# Patient Record
Sex: Male | Born: 1945 | Race: White | Hispanic: No | Marital: Single | Smoking: Former smoker
Health system: Southern US, Community
[De-identification: ages and names within clinical notes are randomized; demographics above are authoritative.]

## PROBLEM LIST (undated history)

## (undated) DIAGNOSIS — I2510 Atherosclerotic heart disease of native coronary artery without angina pectoris: Secondary | ICD-10-CM

## (undated) DIAGNOSIS — I10 Essential (primary) hypertension: Secondary | ICD-10-CM

## (undated) DIAGNOSIS — E7800 Pure hypercholesterolemia, unspecified: Secondary | ICD-10-CM

## (undated) DIAGNOSIS — K219 Gastro-esophageal reflux disease without esophagitis: Secondary | ICD-10-CM

## (undated) DIAGNOSIS — F419 Anxiety disorder, unspecified: Secondary | ICD-10-CM

## (undated) DIAGNOSIS — N400 Enlarged prostate without lower urinary tract symptoms: Secondary | ICD-10-CM

## (undated) HISTORY — PX: HERNIA REPAIR: SHX51

## (undated) HISTORY — PX: CARDIAC SURGERY: SHX584

## (undated) HISTORY — PX: CARDIAC CATHETERIZATION: SHX172

---

## 1998-03-10 ENCOUNTER — Ambulatory Visit (HOSPITAL_COMMUNITY): Admission: RE | Admit: 1998-03-10 | Discharge: 1998-03-10 | Payer: Self-pay | Admitting: Cardiology

## 2000-09-22 ENCOUNTER — Ambulatory Visit (HOSPITAL_COMMUNITY): Admission: RE | Admit: 2000-09-22 | Discharge: 2000-09-22 | Payer: Self-pay | Admitting: Cardiology

## 2001-09-05 ENCOUNTER — Encounter: Payer: Self-pay | Admitting: Cardiology

## 2001-09-05 ENCOUNTER — Ambulatory Visit (HOSPITAL_COMMUNITY): Admission: RE | Admit: 2001-09-05 | Discharge: 2001-09-05 | Payer: Self-pay | Admitting: Cardiology

## 2001-09-07 ENCOUNTER — Ambulatory Visit (HOSPITAL_COMMUNITY): Admission: RE | Admit: 2001-09-07 | Discharge: 2001-09-07 | Payer: Self-pay | Admitting: *Deleted

## 2001-09-26 ENCOUNTER — Ambulatory Visit (HOSPITAL_COMMUNITY): Admission: RE | Admit: 2001-09-26 | Discharge: 2001-09-27 | Payer: Self-pay | Admitting: *Deleted

## 2001-12-26 ENCOUNTER — Ambulatory Visit (HOSPITAL_COMMUNITY): Admission: RE | Admit: 2001-12-26 | Discharge: 2001-12-26 | Payer: Self-pay | Admitting: *Deleted

## 2001-12-26 ENCOUNTER — Encounter: Payer: Self-pay | Admitting: *Deleted

## 2002-10-11 ENCOUNTER — Encounter (HOSPITAL_COMMUNITY): Admission: RE | Admit: 2002-10-11 | Discharge: 2002-11-10 | Payer: Self-pay | Admitting: *Deleted

## 2002-10-11 ENCOUNTER — Encounter: Payer: Self-pay | Admitting: *Deleted

## 2004-07-16 ENCOUNTER — Ambulatory Visit: Payer: Self-pay | Admitting: *Deleted

## 2004-07-23 ENCOUNTER — Ambulatory Visit (HOSPITAL_COMMUNITY): Admission: RE | Admit: 2004-07-23 | Discharge: 2004-07-23 | Payer: Self-pay | Admitting: *Deleted

## 2004-07-23 ENCOUNTER — Ambulatory Visit: Payer: Self-pay | Admitting: *Deleted

## 2005-09-22 ENCOUNTER — Ambulatory Visit: Payer: Self-pay | Admitting: *Deleted

## 2006-01-02 ENCOUNTER — Encounter: Payer: Self-pay | Admitting: Emergency Medicine

## 2006-01-02 ENCOUNTER — Inpatient Hospital Stay (HOSPITAL_COMMUNITY): Admission: AD | Admit: 2006-01-02 | Discharge: 2006-01-04 | Payer: Self-pay | Admitting: Cardiology

## 2006-01-02 ENCOUNTER — Ambulatory Visit: Payer: Self-pay | Admitting: Cardiology

## 2006-01-17 ENCOUNTER — Ambulatory Visit: Payer: Self-pay | Admitting: *Deleted

## 2006-10-12 ENCOUNTER — Ambulatory Visit: Payer: Self-pay | Admitting: Internal Medicine

## 2006-10-19 ENCOUNTER — Encounter: Payer: Self-pay | Admitting: Internal Medicine

## 2006-10-19 ENCOUNTER — Ambulatory Visit: Payer: Self-pay | Admitting: Internal Medicine

## 2006-10-19 ENCOUNTER — Ambulatory Visit (HOSPITAL_COMMUNITY): Admission: RE | Admit: 2006-10-19 | Discharge: 2006-10-19 | Payer: Self-pay | Admitting: Internal Medicine

## 2006-10-19 HISTORY — PX: COLONOSCOPY: SHX174

## 2006-12-26 ENCOUNTER — Ambulatory Visit: Payer: Self-pay | Admitting: Cardiology

## 2007-12-22 IMAGING — CR DG CHEST 1V PORT
1 series · 1 of 1 positions shown · non-contrast
Comparison: 07/23/04.

CLINICAL DATA: Chest pain/history of coronary artery stent placement.  
 PORTABLE CHEST - 1 VIEW 01/02/06:

[view not recorded]
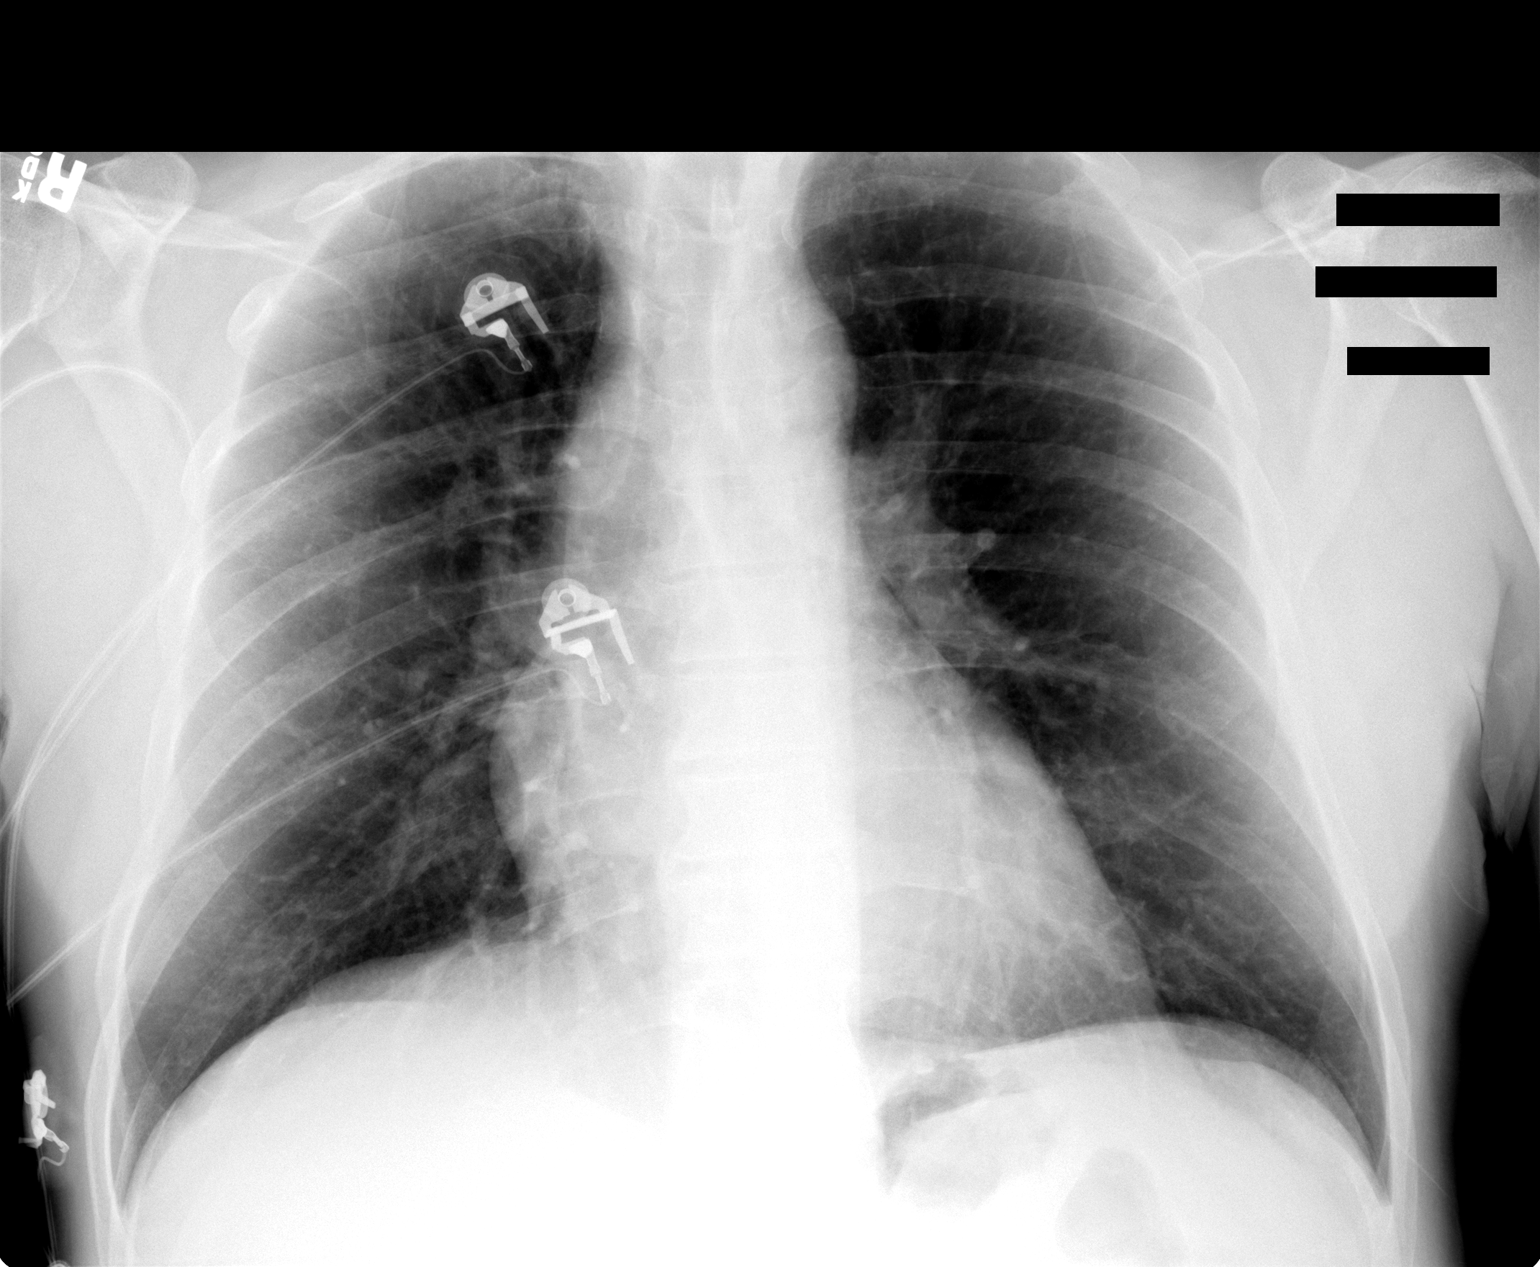

[1 of 1 positions shown; findings below may reference images not displayed]

FINDINGS: The heart size and mediastinal contours are within normal limits.  Both lungs are clear.
IMPRESSION: No acute findings.

## 2008-01-18 ENCOUNTER — Ambulatory Visit: Payer: Self-pay | Admitting: Cardiology

## 2009-11-26 ENCOUNTER — Encounter (INDEPENDENT_AMBULATORY_CARE_PROVIDER_SITE_OTHER): Payer: Self-pay

## 2010-07-08 NOTE — Letter (Signed)
Summary: Recall Colonoscopy/Endoscopy, Change to Office Visit  [facility]  [address]   [place], [place] [address]   Phone: [phone]  Fax: [phone]      November 26, 2009   Connor [name] [address] [place], [place]  [address] September 26, 1945   Dear [name],   According to our records, it is time for you to schedule a Colonoscopy/Endoscopy. However, after reviewing your medical record, we recommend an office visit in order to determine your need for a repeat procedure.  Please call [phone] at your convenience to schedule an office visit. If you have any questions or concerns, please feel free to contact our office.   Sincerely,   Cloria Spring LPN  [facility] Ph: [phone]   Fax: [phone]

## 2010-10-19 NOTE — Consult Note (Signed)
NAME:  Connor [name], Connor [name] NO.:  [id number]   MEDICAL RECORD NO.:  [id number]          PATIENT TYPE:  AMB   LOCATION:  DAY                           FACILITY:  [facility]   PHYSICIAN:  R. Roetta Sessions, M.D. DATE OF BIRTH:  07-15-1945   DATE OF CONSULTATION:  DATE OF DISCHARGE:                                 CONSULTATION   REASON FOR CONSULTATION:  Hemoccult-positive stool.   REFERRING PHYSICIAN:  Kingsley Callander. Ouida Sills, MD   HISTORY OF PRESENT ILLNESS:  Connor [name] is a pleasant 65 year old  Caucasian male sent over through the courtesy of Dr. Ouida Sills for further  workup of Hemoccult-positive stool.  Connor [name] went to Dr. Ouida Sills  recently for routine follow-up.  He underwent digital rectal  examination.  The stool was hemoccult-positive.  Connor [name] has not had  any change in bowel function.  He has 1-3 formed bowel movements daily.  Denies constipation, diarrhea, melena or rectal bleeding.  Has not  really had any abdominal pain.  Denies any change in weight.  Denies any  upper GI tract symptoms such as odynophagia, dysphagia, early satiety,  nausea or vomiting.  He perhaps a couple of reflux episodes monthly  depending on what he eats, for which he might take an over-the-counter  acid blocker with good results.  He does not use any nonsteroidals aside  from one aspirin daily.  His last colonoscopy was his first colonoscopy  at age 84 back on Oct 26, 2006, and this was done for screening  purposes.  I performed the exam and he was found have a normal rectum  and colon.  He was slated to come back in 2011 for a screening exam.   PAST MEDICAL HISTORY:  Crescendo angina and coronary artery disease, for  which he underwent coronary stent placement previously.  He had a  cardiac catheterization last summer, which revealed his stents remain  patent.   PAST SURGERIES:  1. Right inguinal herniorrhaphy.  2. Colonoscopy in 2001.  3. Cardiac catheterization with stent placement and repeat      catheterization last year.   CURRENT MEDICATIONS:  1. Metoprolol 50 mg daily.  2. Vytorin daily.  3. Tramadol daily.  4. Ativan 0.5 mg p.r.n.  5. ASA 325 mg daily.   ALLERGIES:  CODEINE.   FAMILY HISTORY:  Father died with heart attack.  Mother is alive with  diabetes mellitus.  He has 2 brothers and 2 sisters, all of whom have  diabetes.  There is no history of chronic GI or liver illness.   SOCIAL HISTORY:  The patient is widowed.  He is employed with the [name].  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  He does not  smoke.  He rarely consumes alcohol.   REVIEW OF SYSTEMS:  No chest pain or dyspnea on exertion.  No fever,  chills, or change in weight.  Otherwise as in history of present  illness.   PHYSICAL EXAMINATION:  GENERAL:  A pleasant 65 year old gentleman  resting comfortably.  VITAL SIGNS:  Weight 197, height 5 feet  11 inches, temperature 97.5, BP  140/90, pulse 70.  SKIN:  Warm and dry.  He is suntanned.  HEENT:  No scleral icterus.  Conjunctivae pink.  Oral cavity:  No  lesions.  CHEST:  Lungs are clear to auscultation.  CARDIAC:  Regular rate and rhythm without murmur, gallop or rub.  ABDOMEN:  Nondistended.  Positive bowel sounds, soft, nontender, without  appreciable mass or organomegaly.  EXTREMITIES:  No edema.  RECTAL:  Deferred to the time of colonoscopy.   IMPRESSION:  Connor [name] is a pleasant 65 year old gentleman with a  Hemoccult-positive stool on digital rectal examination.  Hemoccult-  positive stool digital rectal examination has somewhat more limited  specificity than three sequential cards obtained with stooling.  However, nonetheless, he is Hemoccult-positive.  It is good to know that  he had a colonoscopy with negative findings in 2001.  There is really  nothing in his history that suggests significant upper gastrointestinal  tract pathology.   RECOMMENDATIONS:  Connor [name] ought to go ahead and have a diagnostic   colonoscopy now.  The potential risks, benefits and alternatives have  been reviewed, questions answered.  He is agreeable.   PLAN:  To perform colonoscopy in very near future at his convenience.  We will make further recommendations at that time.   I would like Dr. Carylon Perches for allowing me to see this nice gentleman  once again.      [name], M.D.  Electronically Signed     RMR/MEDQ  D:  10/12/2006  T:  10/12/2006  Job:  [id number]   cc:   Kingsley Callander. Ouida Sills, MD  Fax: [phone]

## 2010-10-19 NOTE — H&P (Signed)
NAME:  Connor, [name] NO.:  [id number]   MEDICAL RECORD NO.:  [id number]          PATIENT TYPE:  AMB   LOCATION:  DAY                           FACILITY:  [facility]   PHYSICIAN:  R. Roetta Sessions, M.D. DATE OF BIRTH:  May 10, 1946   DATE OF ADMISSION:  10/19/2006  DATE OF DISCHARGE:  LH                              HISTORY & PHYSICAL   PROCEDURE:  Colonoscopy, biopsy.   INDICATIONS FOR PROCEDURE:  The patient is a 65 year old gentleman with  no lower GI tract symptoms having been hemoccult positive on digital  exam recently.  He has one to three formed bowel movements daily.  Has  not had any melena or gross bleeding, no abdominal pain.  No upper GI  tract symptoms.  There is no family history of GI malignancy.  His last  colonoscopy was back in 2001 where he was found to have a normal rectum  and colon.  Colonoscopy is now being done.  This approach has been  discussed with patient at length.  Potential risks, benefits and  alternatives have been reviewed.  Questions answered.  Please see  documentation in medical record.   PROCEDURE NOTE:  O2 saturation, blood pressure, pulse and respirations  were monitored throughout the entire procedure.  Conscious sedation  Versed 4 mg IV, Demerol 5 mg IV, Phenergan 12.5 mg IV push prior to the  procedure.   INSTRUMENT:  Pentax video chip system.   FINDINGS:  Digital rectal exam revealed no abnormalities.  The prep was  good.  Colon:  The colonic mucosa was surveyed from the rectosigmoid  junction through the left, transverse, and right colon to the  appendiceal orifice, ileocecal valve, and cecum. These structures were  well seen and photographed for the record.  There was a tiny 3 mm polyp  at the base of the cecum.  It was cold biopsied/removed.  The scope was  slowly and cautiously withdrawn.  All previously mentioned mucosal  surfaces were again seen.  There were no other colonic abnormalities.  The scope was pulled down  to the rectum for thorough examination of the  rectal mucosa including retroflexed view of the anal verge.  Numerous  minimally friable anal __________.  No hemorrhoids or other  abnormalities were seen.  The patient tolerated the procedure and was  reactivated.   ENDOSCOPIC IMPRESSION:  Minimally friable anal canal with normal rectum,  diminutive cecal polyp status post cone biopsy/removal.  The remainder  of the colonic mucosa appeared normal.   RECOMMENDATIONS:  1. Follow up on pathology.  2. Further recommendations to follow.      [name], M.D.  Electronically Signed     RMR/MEDQ  D:  10/19/2006  T:  10/19/2006  Job:  [id number]   cc:   Kingsley Callander. Ouida Sills, MD  Fax: [phone]

## 2010-10-19 NOTE — Letter (Signed)
January 18, 2008    Kingsley Callander. Ouida Sills, MD  [address]  [place], [place] [address]   RE:  Connor, [name]  MRN:  [id number]  /  DOB:  [birth year]   Dear Connor [name],   Connor [name] returns to the office for continued assessment and treatment  of cardiovascular risk factors.  Since his last visit, he has continued  to superbly.  He is active in both his work and personal lives without  any significant symptoms.  Blood pressure control has been good.  He  continues to refrain from cigarette smoking.  He has been switched from  Vytorin to simvastatin with plans for a lipid profile within the next  month.   PHYSICAL EXAMINATION:  GENERAL:  On exam, pleasant gentleman in no acute  distress.  VITAL SIGNS:  The weight is 199, 1 pound less than last year.  Blood  pressure 125/80, heart rate 70 and regular, and respirations 13.  NECK:  No jugular venous distention; no carotid bruits.  CARDIAC:  Normal first and second heart sounds.  ABDOMEN:  Soft and nontender; no organomegaly.  EXTREMITIES:  No edema; distal pulses intact.   IMPRESSION:  Connor [name] continues to do beautifully.  In the absence of  any cardiopulmonary symptoms, I do not think that annual cardiology  followup is necessary.  Please let me know if he develops any symptoms  worrisome for recurrent myocardial ischemia.  Thank you so much for  sharing in the care of this very nice gentleman.    Sincerely,      Gerrit Friends. Dietrich Pates, MD, [facility]  Electronically Signed    RMR/MedQ  DD: 01/18/2008  DT: 01/19/2008  Job #: [id number]

## 2010-10-19 NOTE — Letter (Signed)
December 26, 2006    Kingsley Callander. Ouida Sills, MD  [address]  [place], [place] [address]   RE:  KAIMEN, PEINE  MRN:  [id number]  /  DOB:  1945/09/12   Dear Channing Mutters:   Mr. Leaf returns to the office for continued management of coronary  artery disease and cardiovascular risk factors. Since he underwent  repeat coronary angiography in July of last year, he has done well. He  has no chest pain. He experiences dyspnea only when working hard outside  in hot weather. He has had no significant medical issues nor has he  required hospitalization or evaluation in the emergency department.   CURRENT MEDICATIONS:  1. Mavik 2 mg daily.  2. Aspirin 81 mg daily.  3. Vytorin 10/20 mg daily.  4. Protonix 40 mg daily.  5. Toprol 50 mg daily.   PHYSICAL EXAMINATION:  GENERAL:  Pleasant, fairly trim gentleman in no  acute distress.  VITAL SIGNS:  The weight is 200 pounds, 9 pounds more than last year.  Blood pressure 130/75, heart rate 75 and regular, respirations 18.  NECK:  No jugular venous distention; normal carotid upstrokes without  bruits.  LUNGS:  Clear.  CARDIAC:  Normal first and second heart sounds; minimal early systolic  ejection murmur.  ABDOMEN:  Soft and nontender; no masses; no organomegaly; aortic  pulsation not palpable.  EXTREMITIES:  No edema.   LABORATORY DATA:  Laboratory obtain in April reveals a good lipid  profile with total cholesterol of 149, triglycerides of 200, HDL of 32  and LDL of 77.   IMPRESSION:  Mr. Penn continues to do well, now 5 years following  stenting of single vessel disease in the right coronary artery. He had  no significant focal disease on coronary angiography one year ago. We  will continue to optimally manage his risk factors and plan a return  visit in 1 year.    Sincerely,      Gerrit Friends. Dietrich Pates, MD, [facility]  Electronically Signed    RMR/MedQ  DD: 12/26/2006  DT: 12/27/2006  Job #: [id number]

## 2010-10-19 NOTE — Op Note (Signed)
NAME:  Connor, [name] NO.:  [id number]   MEDICAL RECORD NO.:  [id number]          PATIENT TYPE:  AMB   LOCATION:  DAY                           FACILITY:  [facility]   PHYSICIAN:  R. Roetta Sessions, M.D. DATE OF BIRTH:  Jul 01, 1945   DATE OF PROCEDURE:  10/19/2006  DATE OF DISCHARGE:                               OPERATIVE REPORT   PROCEDURE:  Colonoscopy, biopsy.   INDICATIONS FOR PROCEDURE:  The patient is a 65 year old gentleman with  no lower GI tract symptoms having been hemoccult positive on digital  exam recently.  He has one to three formed bowel movements daily.  Has  not had any melena or gross bleeding, no abdominal pain.  No upper GI  tract symptoms.  There is no family history of GI malignancy.  His last  colonoscopy was back in 2001 where he was found to have a normal rectum  and colon.  Colonoscopy is now being done.  This approach has been  discussed with patient at length.  Potential risks, benefits and  alternatives have been reviewed.  Questions answered.  Please see  documentation in medical record.   PROCEDURE NOTE:  O2 saturation, blood pressure, pulse and respirations  were monitored throughout the entire procedure.  Conscious sedation  Versed 4 mg IV, Demerol 5 mg IV, Phenergan 12.5 mg IV push prior to the  procedure.   INSTRUMENT:  Pentax video chip system.   FINDINGS:  Digital rectal exam revealed no abnormalities.  The prep was  good.  Colon:  The colonic mucosa was surveyed from the rectosigmoid  junction through the left, transverse, and right colon to the  appendiceal orifice, ileocecal valve, and cecum. These structures were  well seen and photographed for the record.  There was a tiny 3 mm polyp  at the base of the cecum.  It was cold biopsied/removed.  The scope was  slowly and cautiously withdrawn.  All previously mentioned mucosal  surfaces were again seen.  There were no other colonic abnormalities.  The scope was pulled down to  the rectum for thorough examination of the  rectal mucosa including retroflexed view of the anal verge.  Numerous  minimally friable anal __________.  No hemorrhoids or other  abnormalities were seen.  The patient tolerated the procedure and was  reactivated.   ENDOSCOPIC IMPRESSION:  Minimally friable anal canal with normal rectum,  diminutive cecal polyp status post cone biopsy/removal.  The remainder  of the colonic mucosa appeared normal.   RECOMMENDATIONS:  1. Follow up on pathology.  2. Further recommendations to follow.      [name], M.D.  Electronically Signed     RMR/MEDQ  D:  10/19/2006  T:  10/19/2006  Job:  [id number]   cc:   Kingsley Callander. Ouida Sills, MD  Fax: [phone]

## 2010-10-22 NOTE — Cardiovascular Report (Signed)
[facility]  Patient:    Connor [name], Connor [name] Visit Number: [id number] MRN: [id number]          Service Type: CAT Location: [facility] 2895 01 Attending Physician:  Daisey Must Dictated by:   Daisey Must, M.D. [facility] Proc. Date: 09/07/01 Admit Date:  09/07/2001 Discharge Date: 09/07/2001   CC:         Carylon Perches, M.D.  Thomas C. Wall, M.D. [facility]  Cardiac Catheterization Lab   Cardiac Catheterization  PROCEDURE:  Left heart catheterization with coronary angiography and left ventriculography.  CARDIOLOGIST:  Daisey Must, M.D. [facility]  INDICATION:  Mr. Larusso is a 65 year old male with history of moderate coronary artery disease.  He has been having recurrent substernal chest tightness with exertion as well as occasional episodes at rest.  He was referred for cardiac catheterization.  PROCEDURAL NOTE:  A 6-French sheath was placed in the right femoral artery. Standard Judkins 6-French catheters were utilized.  Contrast was Omnipaque. There were no complications.  CATHETERIZATION RESULTS:  HEMODYNAMICS:  Left ventricular pressure 110/13, aortic pressure 110/70. There was no aortic valve gradient.  LEFT VENTRICULOGRAM:  Wall motion is normal.  Ejection fraction calculated at 62%.  There is no mitral regurgitation.  CORONARY ANGIOGRAPHY: (Right dominant).  Left main has an ostial 30% stenosis.  There was some ventricularization of pressure wave form with catheter engagement.  This did seem to improve with intracoronary nitroglycerin.  Left anterior descending artery has a tubular 30% stenosis in the proximal vessel, 30% in the mid vessel, and diffuse 20% stenosis in the distal vessel. The LAD gives rise to a normal size first diagonal which has a 20% stenosis and a small second diagonal which has a 30% stenosis.  Left circumflex has a 30% stenosis in the mid vessel.  It gives rise to a small OM-1 and a large OM-2.  The right coronary artery  has a focal 60% stenosis in the proximal vessel. The mid vessel has a 30 to 40% stenosis, and the distal vessel has a diffuse 20% stenosis.  The distal right coronary artery gives rise to a normal size posterior descending artery, a small first posterolateral branch, an normal size second posterolateral branch, and a small posterolateral branch.  IMPRESSIONS: 1. Normal left ventricular systolic function. 2. One-vessel coronary artery disease characterized by moderate disease in    the proximal right coronary artery of borderline severity.  PLAN:  A stress Cardiolite will be scheduled to rule out ischemia.  If this does demonstrated ischemia in the inferior wall of the right coronary artery, it could be treated percutaneously.  Otherwise the patient will be managed medically. Dictated by:   Daisey Must, M.D. [facility] Attending Physician:  Daisey Must DD:  09/07/01 TD:  09/08/01 Job: [id number]

## 2010-10-22 NOTE — H&P (Signed)
NAME:  Connor [name], Connor [name] NO.:  [id number]   MEDICAL RECORD NO.:  [id number]          PATIENT TYPE:  INP   LOCATION:  6531                         FACILITY:  [facility]   PHYSICIAN:  Olga Millers, M.D. LHCDATE OF BIRTH:  03-31-1946   DATE OF ADMISSION:  01/02/2006  DATE OF DISCHARGE:                                HISTORY & PHYSICAL   PRIMARY CARDIOLOGIST:  Dr. Vida Roller   REASON FOR ADMISSION:  Mr. Liz is a 65 year old male, with history of  single-vessel coronary artery disease status post prior stenting (non-drug-  eluting) of the proximal right coronary artery in April 2003, who now is  transferred from [facility] ER with symptoms worrisome for  unstable angina pectoris.   The patient has not had any subsequent catheterizations since undergoing  percutaneous intervention.  An exercise stress Cardiolite in May of 2004 was  notable for chest pain and associated ST abnormalities, but no evidence of  ischemia by perfusion imaging; calculated ejection fraction 61%.   At approximately 4:30 this morning, after the patient had already been up  and about, he developed lower sternal chest tightness which then developed  into more of a pain (6/10), reminiscent of his pre - PCI symptoms in the  2003.  Also noted some radiation to the right shoulder and some nausea, but  no vomiting.  He had no associated diaphoresis or dyspnea.  He took one  sublingual nitroglycerin, with some improvement in symptoms but not complete  resolution.  He was then taken to the emergency room by his sister where he  was treated with additional sublingual nitroglycerin, IV nitroglycerin, and  heparin.  He reports improvement of his symptoms with subsequent resolution  while en route.  He is currently asymptomatic.   Initial cardiac markers are negative.  Electrocardiogram notable for sinus  bradycardia at 58 bpm with LVH but no ischemic changes.   Of note, the patient  reports having taken his medications earlier today.   The patient does report some occasional chest pain in the recent past, most  notably about a week ago while working in the yard.  He did have some chest  tightness at that time, but pointed out that this morning's episode was much  more pronounced.   ALLERGIES:  CODEINE.   CURRENT MEDICATIONS:  1.  Toprol XL 50 mEq daily.  2.  Vytorin 10/20 daily.  3.  Coated aspirin 81 daily.  4.  Mavik two daily.  5.  Nitrostat p.r.n.   PAST MEDICAL HISTORY:  1.  Coronary artery disease.      1.  As outlined above.  2.  Hypertension.  3.  Hyperlipidemia.  4.  Gastroesophageal reflux disease.  5.  Remote right inguinal hernia repair.   SOCIAL HISTORY:  Patient is widowed, currently living with one of his  sisters in [place].  He has no children.  He is a retired Garment/textile technologist for [employer]  He has not smoked tobacco since 2000 and  drinks alcohol only on occasion.   FAMILY HISTORY:  Father died  at age 76, secondary to fatal myocardial  infarction (his first). Mother age 49, alive and well with no known history  of heart disease.  Brother age 65, history of MI.   REVIEW OF SYSTEMS:  As noted per HPI, no recent evidence of overt GI  bleeding.  Denies intermittent claudication.  Denies orthopnea, PND,  exertional dyspnea, or edema.  Remaining systems negative.   PHYSICAL EXAMINATION:  VITAL SIGNS:  Blood pressure 120/60, pulse of  currently the low 60s, temperature 97.9, saturations 100% on 3 liters.  GENERAL:  65 year old male in no apparent distress.  HEENT:  Normocephalic, atraumatic.  NECK:  Palpable carotid pulse without bruits.  LUNGS: Clear to auscultation in all fields.  HEART:  Regular rhythm (S1 and S2), no murmurs, rubs or gallops.  ABDOMEN:  Soft, nontender with intact bowel sounds without bruits.  EXTREMITIES:  Palpable femoral pulses without bruits; palpable dorsalis  pedis pulses without edema.   NEUROLOGY:  No focal deficit.   Admission chest x-ray:  Pending.   Admission electrocardiogram:  Sinus bradycardia 58 bpm with normal axis; LVH  by voltage; no ischemic changes.   LABORATORY DATA:  MB 2.1, troponin I less than 0.05.  Sodium 138, potassium  4.2, BUN 10, creatinine 1.0, glucose 104.   IMPRESSION:  1.  Unstable angina pectoris.  2.  Single-vessel coronary artery disease.      1.  Status post stent (non-drug-eluting) proximal right coronary artery          in 2003.      2.  Normal left ventricular function.      3.  Non-ischemic exercise stress Cardiolite 2004.  3.  Sinus bradycardia.      1.  Tolerating beta-blocker.  4.  Hypertension.  5.  Hyperlipidemia.  6.  Gastroesophageal reflux disease.  7.  History of tobacco.   PLAN:  The patient will be admitted to telemetry unit for close monitoring  and further evaluation and management of symptoms worrisome for unstable  angina pectoris.   RECOMMENDATIONS:  Proceed with cardiac catheterization tomorrow, and the  patient is agreeable with this plan.  The risks/benefits have been  discussed.   The patient will be continued on aspirin, Toprol with close monitoring for  bradycardia, statin, and ACE inhibitor.  We will also continue IV  nitroglycerin and heparin and consider addition of a 2B3A inhibitor if  cardiac markers are abnormal.   In addition to cycling cardiac markers, we will check a complete metabolic  profile, CBC/diff, PT/PTT, TSH, and fasting lipid profile in morning.  EKG  will also reported a more pressure and EKG will be repeated in the morning  as well.      Gene Serpe, P.A. [facility]    ______________________________  Olga Millers, M.D. [facility]    GS/MEDQ  D:  01/02/2006  T:  01/02/2006  Job:  [id number]   cc:   Kingsley Callander. Ouida Sills, MD  Fax: [phone]

## 2010-10-22 NOTE — Procedures (Signed)
   NAME:  Connor [name], Connor [name] NO.:  [id number]   MEDICAL RECORD NO.:  [id number]                    PATIENT TYPE:  PREC   LOCATION:                                       FACILITY:   PHYSICIAN:  Vida Roller, M.D.                DATE OF BIRTH:  03/25/1946   DATE OF PROCEDURE:  10/11/2002  DATE OF DISCHARGE:                                    STRESS TEST   EXERCISE CARDIOLITE   INDICATION:  The patient is a 65 year old gentleman with history of coronary  artery disease status post PTCA and stenting of the RCA in April 2003.  He  had normal LV function at that time.  He has had no objective assessment  since that time.  He comes in complaining with recurrent chest discomfort  which is relieved by nitroglycerin.  He denies any chest discomfort with  exertion.   BASELINE DATA:  EKG shows sinus rhythm at 52 beats per minute with  nonspecific ST abnormalities.  Blood pressure 150/82.   DESCRIPTION OF PROCEDURE:  The patient exercised for eight minutes 57  seconds to Bruce Protocol stage 3 and 10.1 METS.  The patient reported a  chest tightness of about 5/10 at seven-and-a-half minutes into exercise  accompanied by shortness of breath.  This gradually resolved in recovery.  EKG showed no arrhythmias; however, did show minimal ST depression in leads  II, V4, and V5.  These changes resolved in recovery.  Maximum heart rate  achieved was 141 beats per minute which is 87% of predicted maximum.  Maximum blood pressure was 200/90.   Final images and results are pending M.D. review.     Amy Mercy Riding, P.A. [facility]                     Vida Roller, M.D.    AB/MEDQ  D:  10/11/2002  T:  10/11/2002  Job:  [id number]

## 2010-10-22 NOTE — Discharge Summary (Signed)
NAME:  ALIF, PETRAK NO.:  [id number]   MEDICAL RECORD NO.:  [id number]          PATIENT TYPE:  INP   LOCATION:  6531                         FACILITY:  [facility]   PHYSICIAN:  Micheline Chapman, MD   DATE OF BIRTH:  22-Dec-1945   DATE OF ADMISSION:  01/02/2006  DATE OF DISCHARGE:  01/04/2006                                 DISCHARGE SUMMARY   ADDENDUM:  Post catheterization, Mr. Atchley cath site was stable but he was  having difficulty voiding.  An in and out Foley got over 1000 mL.  This was  removed and he attempted again to void but was unable to void. A Foley  catheter was inserted and a urology consult was called.   Mr. Stanforth was seen by Dr. Wanda Plump. Dr. Wanda Plump recommended discharging  Mr. Iacovelli with the Foley catheter in place.  He was given instructions on  its removal and follow up with urology.   On January 04, 2006,  Mr. Formica was tolerating the Foley catheter very well.  He was not having any chest pain or shortness of breath and considered  stable for discharge with outpatient follow-up arranged.   DISCHARGE INSTRUCTIONS:  As previously with the exception of he is to  discontinue the Foley catheter as instructed on January 05, 2006 and follow up  with Dr. Wanda Plump on January 05, 2006 at 1:40 p.m.      Theodore Demark, P.A. [facility]      Micheline Chapman, MD  Electronically Signed    RB/MEDQ  D:  01/04/2006  T:  01/04/2006  Job:  [id number]   cc:   Kingsley Callander. Ouida Sills, MD  [employer] [facility], M.D.

## 2010-10-22 NOTE — Discharge Summary (Signed)
NAME:  JAIRON, RIPBERGER NO.:  [id number]   MEDICAL RECORD NO.:  [id number]          PATIENT TYPE:  INP   LOCATION:  6531                         FACILITY:  [facility]   PHYSICIAN:  Clarcona Bing, M.D. [facility] OF BIRTH:  09-05-1945   DATE OF ADMISSION:  01/02/2006  DATE OF DISCHARGE:                                 DISCHARGE SUMMARY   PROCEDURES:  1.  Cardiac catheterization.  2.  Coronary arteriogram.  3.  Left ventriculogram.   PRIMARY DIAGNOSES:  1.  Chest pain, cardiac enzymes negative for myocardial infarction and      proton pump inhibitor added.  2.  Status post bare-metal stent to the RCA in April 2003.  3.  Hypertension.  4.  Hyperlipidemia.  5.  Status post hernia repair.  6.  ALLERGY OR INTOLERANCE TO CODEINE.  7.  Family history of premature coronary artery disease.   Time at discharge 33 minutes.   HOSPITAL COURSE:  Mr. Elliot Gurney is a 65 year old male with a previous history of  coronary artery disease.  He develop mid sternal chest tightness that  reached a 6/10 and was reminiscent of his pre PCI symptoms.  His pain  persisted, so he came to the emergency room.  He was admitted for further  evaluation and treatment.   His chest pain resolved.  His cardiac enzymes were negative for MI.  It was  felt that cardiac catheterization was indicated, and this was performed on  January 03, 2006.   The cardiac catheterization showed less than 10% in-stent restenosis in the  RCA.  He had a 30% lesion in the LAD and circumflex, but this was  nonobstructive, and his EF was 60%.   Post cath, Mr. Hoglund was having some problems voiding.  If this resolves and  his cath site remained stable, he is tentatively considered stable for  discharge on January 03, 2006 with outpatient follow-up arranged.   DISCHARGE INSTRUCTIONS:  His activity level is to be reduced for the next 48  hours.  He is to call our office for problems with the catheterization site.  He is to follow up  with Dr. Dorethea Clan on August 14 at 1:15, and he is to follow  up with Dr. Ouida Sills as needed.   DISCHARGE MEDICATIONS:  1.  Toprol XL 50 mg q.d.  2.  Mavik 2 mg q.d.  3.  Vytorin 10/20 mg q.d.  4.  Coated aspirin 81 mg q.d.  5.  Nitroglycerin sublingual p.r.n.  6.  Protonix 40 mg q. day.      Theodore Demark, P.A. [facility]      [place] Bing, M.D. [facility]  Electronically Signed    RB/MEDQ  D:  01/03/2006  T:  01/03/2006  Job:  [id number]   cc:   Kingsley Callander. Ouida Sills, MD

## 2010-10-22 NOTE — Assessment & Plan Note (Signed)
[facility]                         [place] [facility] OFFICE NOTE   CODEY, BURLING                        MRN:          [id number]  DATE:01/17/2006                            DOB:          [birth year]    PRIMARY CARE PHYSICIAN:  Kingsley Callander. Ouida Sills, M.D.   REASON FOR VISIT:  Mr. Deatra Ina returns.  He had a heart catheterization on  January 03, 2006, for some chest discomfort, ruled out for myocardial  infarction, and the heart catheterization showed less than the 10% in-stent  restenosis, non-obstructive disease in the other coronary arteries.  LV  systolic function was normal.  He has done fine since then.  No pain, no  troubles, walking without any difficulties, back to work already.   CURRENT MEDICATIONS:  1. Mavik 2 mg daily.  2. Aspirin 81 mg daily.  3. Vytorin 10/20 once daily.  4. Protonix 40 mg daily.  5. Flomax one pill daily.  6. Toprol XL 50 mg daily.   PHYSICAL EXAMINATION:  VITAL SIGNS:  His blood pressure is 142/80, his pulse  is 88, he weighs 191 pounds.  CHEST:  Clear.  NECK:  He has no jugular venous distention or carotid bruits.  EXTREMITIES:  His lower extremities are without edema.  CARDIOVASCULAR:  Regular with no murmur.  SKIN:  Calf site looks well-healed.  There is no bruit, no ecchymoses.   PLAN:  To see him back in a year for routine followup, address his secondary  prophylaxis for coronary disease.                                   Farris Has. Dorethea Clan, MD   JMH/MedQ  DD:  01/17/2006  DT:  01/17/2006  Job #:  [id number]   cc:   Kingsley Callander. Ouida Sills, MD

## 2010-10-22 NOTE — Procedures (Signed)
NAME:  Connor [name], Connor [name] NO.:  [id number]   MEDICAL RECORD NO.:  [id number]          PATIENT TYPE:  OUT   LOCATION:  RAD                           FACILITY:  [facility]   PHYSICIAN:  Vida Roller, M.D.   DATE OF BIRTH:  Jun 05, 1946   DATE OF PROCEDURE:  07/23/2004  DATE OF DISCHARGE:                                  ECHOCARDIOGRAM   PRIMARY CARE PHYSICIAN:  Kingsley Callander. Ouida Sills, MD.   TAPE NUMBER:  [id number].   TAPE COUNT:  [id number].   HISTORY:  This is a 65 year old man with dyspnea and known coronary disease.   DESCRIPTION OF PROCEDURE:  The technical quality of the study is adequate.  M-mode tracing of the aorta is 32 mm.   The left atrium is 40 mm.   The septum is 12 mm.   The posterior wall is 12 mm.   The left ventricular diastolic dimension is 38 mm.   The left ventricular systolic dimension is 27 mm.   2-D AND DOPPLER IMAGING:  The left ventricle is normal size with normal  systolic function.  The estimated ejection fraction is 55-65%.  There are no  obvious wall motion abnormalities.  There was mild concentric left  ventricular hypertrophy.   The right ventricle is normal size with normal systolic function.   The right atrium appears to be normal size though difficult to see.  The  left atrium is just slightly enlarged.   There is no pericardial effusion.   The aortic valve is mildly sclerotic with no stenosis or regurgitation.   The mitral valve has mild annular calcification with trace regurgitation.   The tricuspid valve has trace regurgitation.   The pulmonic valve is not well seen.   The inferior vena cava is not well seen.   The aortic size appears to be top normal, though it is difficult to say, as  this was not adequately measured and only seen in two views.      JH/MEDQ  D:  07/23/2004  T:  07/23/2004  Job:  [id number]   cc:   Kingsley Callander. Ouida Sills, MD  [address]  [place]  [place] [address]  Fax: [phone]

## 2010-10-22 NOTE — Cardiovascular Report (Signed)
NAME:  Connor [name], TENCH NO.:  [id number]   MEDICAL RECORD NO.:  [id number]          PATIENT TYPE:  INP   LOCATION:  6531                         FACILITY:  [facility]   PHYSICIAN:  Micheline Chapman, MD   DATE OF BIRTH:  08-Apr-1946   DATE OF PROCEDURE:  01/03/2006  DATE OF DISCHARGE:                              CARDIAC CATHETERIZATION   PROCEDURE PERFORMED:  1.  Left heart catheterization.  2.  Coronary angiogram.  3.  Left ventricular angiogram.  4.  Arterial closure with an Angio-Seal.   INDICATIONS:  [name] is a 65 year old male with known coronary artery  disease status post stenting of the right coronary artery in 2003 who  presented with unstable angina.  He was referred for cardiac catheterization  for definitive evaluation of his coronary anatomy.   PROCEDURE NOTE:  The risks and indications of the procedure were explained  and the patient fully understood.  Informed consent was obtained.  The right  groin was prepped and draped in normal sterile fashion.  Lidocaine 1% was  used for local anesthesia.  The patient was given 1 mg of Versed and 25 mcg  of fentanyl for conscious sedation.  Right femoral arterial access was  obtained using the modified Seldinger technique and a 6-French right femoral  arterial sheath was placed.  Catheters used included a 6-French angled  pigtail, JL-4, and a JR-4.  At the conclusion of the procedure a 6-French  Angio-Seal closure device was deployed in the common femoral artery.   FINDINGS:  Aortic pressure was 102/64 with a mean of 81.  Left ventricular  systolic pressure was 114 and diastolic pressure was 12.   Left coronary artery:  The left main coronary artery was normal.  The left  anterior descending had a 30% stenosis just after the first septal  perforator in the proximal segment.  The mid and distal left anterior  descending coronary had no significant disease.  The first diagonal branch  was a medium-sized vessel  and was normal.  The left circumflex vessel had a  30% proximal stenosis and the remainder of the left circumflex was normal.  There was a small first obtuse marginal branch present without significant  disease and a medium-sized ramus branch that also had no significant  disease.   Right coronary artery:  There was 30-40% stenosis of the proximal right  coronary artery.  The right coronary artery gave off a large first marginal  branch that was normal.  The mid and distal RCA were normal.  The RCA gave  rise to a dominant PDA that had no angiographic disease.   Left ventricular angiogram demonstrated normal left ventricular systolic  function with an ejection fraction approximately 60%.   COMPLICATIONS:  None.   IMPRESSION:  Nonobstructive coronary artery disease.   PLAN:  Continue medical management and plan on discharge home later today  after observation.      Micheline Chapman, MD  Electronically Signed     MDC/MEDQ  D:  01/03/2006  T:  01/03/2006  Job:  [id number]   cc:  Kingsley Callander. Ouida Sills, MD  [facility]

## 2010-10-22 NOTE — Cardiovascular Report (Signed)
[facility]  Patient:    Connor [name], Connor [name] Visit Number: [id number] MRN: [id number]          Service Type: CAT Location: [id number] Attending Physician:  Daisey Must Dictated by:   Daisey Must, M.D. [facility] Proc. Date: 09/26/01 Admit Date:  09/26/2001   CC:         Carylon Perches, M.D.  Thomas C. Wall, M.D. [facility]  Cardiac Catheterization Lab   Cardiac Catheterization  PROCEDURES PERFORMED:  PTCA with stent placement in the proximal right coronary artery.  INDICATIONS:  Mr. Schill is a 65 year old male who has been having exertional angina.  Cardiac catheterization performed approximately three weeks ago revealed a lesion in the proximal right coronary artery of borderline severity.  It was estimated at 60-75% diameter stenosis.  Because of the borderline severity of the lesion, we opted to proceed with a stress imaging study to evaluate for potential ischemia.  The patient underwent a stress Cardiolite in the office, which did reveal a presence of mild inferior infarct with peri-infarct ischemia.  In addition, the patients symptoms of chest pain were reproduced on the treadmill.  We therefore opted to proceed with percutaneous intervention.  PTCA PROCEDURAL NOTE:  We initially placed a 7-French sheath in the right femoral artery.  Angiomax was administered per protocol.  We used a a 7-French JR4 guiding catheter and a BMW wire.  Angiographic images of the right coronary artery were obtained, revealing a 75% stenosis in the proximal vessel, and what was a moderately tortuous artery.  We then deployed a 3.0 x 12 mm Express II stent at a deployment pressure of 15 atm.  The stent was post-dilated with a 3.5 x 8 mm Quantum balloon inflated to 16 atm in the distal aspect of the stent, and 18 atm in the proximal aspect of the stent.  Final angiographic images revealed patency of the right coronary artery, with less than 10% residual stenosis and  TIMI-3 flow.  COMPLICATIONS:  None.  RESULTS:  Successful PTCA with stent placement in the proximal right coronary artery, reducing a 75% stenosis to less than 10% residual with TIMI-3 flow.  PLAN:  Angiomax will be discontinued at this time.  The patient should remain on Plavix for four weeks, and aspirin indefinitely. Dictated by:   Daisey Must, M.D. [facility] Attending Physician:  Daisey Must DD:  09/26/01 TD:  09/26/01 Job: [id number]

## 2011-11-15 DIAGNOSIS — I2510 Atherosclerotic heart disease of native coronary artery without angina pectoris: Secondary | ICD-10-CM | POA: Diagnosis not present

## 2012-04-03 DIAGNOSIS — Z23 Encounter for immunization: Secondary | ICD-10-CM | POA: Diagnosis not present

## 2012-05-09 DIAGNOSIS — E785 Hyperlipidemia, unspecified: Secondary | ICD-10-CM | POA: Diagnosis not present

## 2012-05-09 DIAGNOSIS — Z125 Encounter for screening for malignant neoplasm of prostate: Secondary | ICD-10-CM | POA: Diagnosis not present

## 2012-05-09 DIAGNOSIS — I2510 Atherosclerotic heart disease of native coronary artery without angina pectoris: Secondary | ICD-10-CM | POA: Diagnosis not present

## 2012-05-09 DIAGNOSIS — Z79899 Other long term (current) drug therapy: Secondary | ICD-10-CM | POA: Diagnosis not present

## 2012-05-16 DIAGNOSIS — I2510 Atherosclerotic heart disease of native coronary artery without angina pectoris: Secondary | ICD-10-CM | POA: Diagnosis not present

## 2012-05-16 DIAGNOSIS — Z1212 Encounter for screening for malignant neoplasm of rectum: Secondary | ICD-10-CM | POA: Diagnosis not present

## 2012-11-14 DIAGNOSIS — I2510 Atherosclerotic heart disease of native coronary artery without angina pectoris: Secondary | ICD-10-CM | POA: Diagnosis not present

## 2012-12-27 ENCOUNTER — Encounter (HOSPITAL_COMMUNITY): Payer: Self-pay | Admitting: *Deleted

## 2012-12-27 ENCOUNTER — Emergency Department (HOSPITAL_COMMUNITY)
Admission: EM | Admit: 2012-12-27 | Discharge: 2012-12-27 | Disposition: A | Discharge status: Home / Self Care | Payer: Medicare Other | Attending: Emergency Medicine | Admitting: Emergency Medicine

## 2012-12-27 DIAGNOSIS — R110 Nausea: Secondary | ICD-10-CM | POA: Insufficient documentation

## 2012-12-27 DIAGNOSIS — R3989 Other symptoms and signs involving the genitourinary system: Secondary | ICD-10-CM | POA: Insufficient documentation

## 2012-12-27 DIAGNOSIS — E7800 Pure hypercholesterolemia, unspecified: Secondary | ICD-10-CM | POA: Insufficient documentation

## 2012-12-27 DIAGNOSIS — R000 Tachycardia, unspecified: Secondary | ICD-10-CM | POA: Diagnosis not present

## 2012-12-27 DIAGNOSIS — Z7982 Long term (current) use of aspirin: Secondary | ICD-10-CM | POA: Insufficient documentation

## 2012-12-27 DIAGNOSIS — Z79899 Other long term (current) drug therapy: Secondary | ICD-10-CM | POA: Insufficient documentation

## 2012-12-27 DIAGNOSIS — M549 Dorsalgia, unspecified: Secondary | ICD-10-CM | POA: Diagnosis not present

## 2012-12-27 DIAGNOSIS — R350 Frequency of micturition: Secondary | ICD-10-CM | POA: Insufficient documentation

## 2012-12-27 DIAGNOSIS — R3915 Urgency of urination: Secondary | ICD-10-CM | POA: Insufficient documentation

## 2012-12-27 DIAGNOSIS — R109 Unspecified abdominal pain: Secondary | ICD-10-CM | POA: Diagnosis not present

## 2012-12-27 DIAGNOSIS — I2510 Atherosclerotic heart disease of native coronary artery without angina pectoris: Secondary | ICD-10-CM | POA: Diagnosis not present

## 2012-12-27 DIAGNOSIS — R300 Dysuria: Secondary | ICD-10-CM | POA: Diagnosis not present

## 2012-12-27 DIAGNOSIS — R339 Retention of urine, unspecified: Secondary | ICD-10-CM | POA: Insufficient documentation

## 2012-12-27 HISTORY — DX: Atherosclerotic heart disease of native coronary artery without angina pectoris: I25.10

## 2012-12-27 HISTORY — DX: Pure hypercholesterolemia, unspecified: E78.00

## 2012-12-27 LAB — BASIC METABOLIC PANEL
BUN: 28 mg/dL — ABNORMAL HIGH (ref 6–23)
CO2: 26 mEq/L (ref 19–32)
Calcium: 10.4 mg/dL (ref 8.4–10.5)
Chloride: 100 mEq/L (ref 96–112)
Creatinine, Ser: 1.47 mg/dL — ABNORMAL HIGH (ref 0.50–1.35)
GFR calc Af Amer: 54 mL/min — ABNORMAL LOW (ref >90)
GFR calc non Af Amer: 47 mL/min — ABNORMAL LOW (ref >90)
Glucose, Bld: 125 mg/dL — ABNORMAL HIGH (ref 70–99)
Potassium: 4 mEq/L (ref 3.5–5.1)
Sodium: 137 mEq/L (ref 135–145)

## 2012-12-27 LAB — URINALYSIS, ROUTINE W REFLEX MICROSCOPIC
Bilirubin Urine: NEGATIVE
Glucose, UA: NEGATIVE mg/dL
Ketones, ur: NEGATIVE mg/dL
Leukocytes, UA: NEGATIVE
Nitrite: NEGATIVE
Protein, ur: NEGATIVE mg/dL
Specific Gravity, Urine: 1.025 (ref 1.005–1.030)
Urobilinogen, UA: 0.2 mg/dL (ref 0.0–1.0)
pH: 6 (ref 5.0–8.0)

## 2012-12-27 LAB — URINE MICROSCOPIC-ADD ON

## 2012-12-27 LAB — CBC WITH DIFFERENTIAL/PLATELET
Basophils Absolute: 0 10*3/uL (ref 0.0–0.1)
Basophils Relative: 0 % (ref 0–1)
Eosinophils Absolute: 0 10*3/uL (ref 0.0–0.7)
Eosinophils Relative: 0 % (ref 0–5)
HCT: 48.7 % (ref 39.0–52.0)
Hemoglobin: 17.5 g/dL — ABNORMAL HIGH (ref 13.0–17.0)
Lymphocytes Relative: 9 % — ABNORMAL LOW (ref 12–46)
Lymphs Abs: 1 10*3/uL (ref 0.7–4.0)
MCH: 31.2 pg (ref 26.0–34.0)
MCHC: 35.9 g/dL (ref 30.0–36.0)
MCV: 86.8 fL (ref 78.0–100.0)
Monocytes Absolute: 1.2 10*3/uL — ABNORMAL HIGH (ref 0.1–1.0)
Monocytes Relative: 11 % (ref 3–12)
Neutro Abs: 9.5 10*3/uL — ABNORMAL HIGH (ref 1.7–7.7)
Neutrophils Relative %: 80 % — ABNORMAL HIGH (ref 43–77)
Platelets: 155 10*3/uL (ref 150–400)
RBC: 5.61 MIL/uL (ref 4.22–5.81)
RDW: 13.7 % (ref 11.5–15.5)
WBC: 11.8 10*3/uL — ABNORMAL HIGH (ref 4.0–10.5)

## 2012-12-27 MED ORDER — HYDROMORPHONE HCL PF 1 MG/ML IJ SOLN
1.0000 mg | Freq: Once | INTRAMUSCULAR | Status: AC
  Administered 2012-12-27: 1 mg via INTRAVENOUS
  Filled 2012-12-27: qty 1

## 2012-12-27 MED ORDER — ONDANSETRON HCL 4 MG/2ML IJ SOLN
4.0000 mg | Freq: Once | INTRAMUSCULAR | Status: AC
  Administered 2012-12-27: 4 mg via INTRAVENOUS
  Filled 2012-12-27: qty 2

## 2012-12-27 MED ORDER — MORPHINE SULFATE 4 MG/ML IJ SOLN
6.0000 mg | Freq: Once | INTRAMUSCULAR | Status: AC
  Administered 2012-12-27: 6 mg via INTRAVENOUS
  Filled 2012-12-27: qty 1

## 2012-12-27 MED ORDER — MORPHINE SULFATE 2 MG/ML IJ SOLN
INTRAMUSCULAR | Status: AC
  Filled 2012-12-27: qty 1

## 2012-12-27 MED ORDER — SODIUM CHLORIDE 0.9 % IV BOLUS (SEPSIS)
1000.0000 mL | Freq: Once | INTRAVENOUS | Status: AC
  Administered 2012-12-27: 1000 mL via INTRAVENOUS

## 2012-12-27 NOTE — ED Notes (Signed)
Attempted to use bladder scanner. Unable due to battery dead. Pt cathed with # 14 initial urine return 250 cc dark amber urine.

## 2012-12-27 NOTE — ED Notes (Signed)
Hx of "bladder blockage", pt unable to states what was causing blockage, pt c/o "dribble" with urinating, last voided an hour ago, c/o pain to lower abd that radiates to lower back

## 2012-12-27 NOTE — ED Provider Notes (Signed)
History  This chart was scribed for Raeford Razor, MD by Bennett Scrape, ED Scribe. This patient was seen in room APA15/APA15 and the patient's care was started at 2:07 PM.  CSN: [id number] Arrival date & time 12/27/12  1240  First MD Initiated Contact with Patient 12/27/12 1344     Chief Complaint  Patient presents with  . Dysuria    The history is provided by the patient. No language interpreter was used.    HPI Comments: Connor [name] is a 67 y.o. male who presents to the Emergency Department complaining of 2 days of suprapubic abdominal pain that began radiating around the the bilateral lower back this morning. He describes the pain as pressure and lists difficulty urinating described as increased straining, dysuria, frequency, urgency, nausea and decreased urine output as associated symptoms. He reports that urination worsens the pain, but he denies any other modifying factors. He admits to experiencing one prior episode that resolved after one day 2 to 3 months ago. He denies following up with a specialist. He denies emesis, fevers, chills or penile, testicular or groin lesions or abnormalities. He reports a prior hernia surgery done through the umbilicus, but he denies any recent abdominal or back surgeries. He states that he was seen at [facility] 6 or 7 years ago for a kidney blockage but denies needing any follow up or surgeries for this condition.  Past Medical History  Diagnosis Date  . Coronary artery disease   . High cholesterol    Past Surgical History  Procedure Laterality Date  . Cardiac surgery    . Hernia repair     No family history on file. History  Substance Use Topics  . Smoking status: Never Smoker   . Smokeless tobacco: Not on file  . Alcohol Use: No    Review of Systems  Gastrointestinal: Positive for nausea and abdominal pain. Negative for vomiting.  Genitourinary: Positive for dysuria, urgency, frequency and difficulty urinating. Negative for  discharge.  Musculoskeletal: Positive for back pain.  All other systems reviewed and are negative.    Allergies  Other  Home Medications   Current Outpatient Rx  Name  Route  Sig  Dispense  Refill  . aspirin EC 81 MG tablet   Oral   Take 81 mg by mouth daily.         Marland Kitchen LORazepam (ATIVAN) 1 MG tablet   Oral   Take 1 mg by mouth every 8 (eight) hours.         . metoprolol (LOPRESSOR) 100 MG tablet   Oral   Take 100 mg by mouth daily.         . naproxen sodium (ALEVE) 220 MG tablet   Oral   Take 220 mg by mouth 2 (two) times daily as needed.         . nitroGLYCERIN (NITROSTAT) 0.4 MG SL tablet   Sublingual   Place 0.4 mg under the tongue every 5 (five) minutes as needed for chest pain.         . ranitidine (ZANTAC) 150 MG tablet   Oral   Take 150 mg by mouth daily as needed for heartburn.         . simvastatin (ZOCOR) 40 MG tablet   Oral   Take 40 mg by mouth at bedtime.         . trandolapril (MAVIK) 2 MG tablet   Oral   Take 2 mg by mouth daily.  Triage Vitals: BP 142/87  Pulse 72  Temp(Src) 98 F (36.7 C) (Oral)  Resp 18  SpO2 98%  Physical Exam  Nursing note and vitals reviewed. Constitutional: He is oriented to person, place, and time. He appears well-developed and well-nourished. No distress.  Uncomfortable appearing   HENT:  Head: Normocephalic and atraumatic.  Eyes: Conjunctivae and EOM are normal.  Neck: Neck supple. No tracheal deviation present.  Cardiovascular: Normal rate.   Mildly tachy  Pulmonary/Chest: Effort normal and breath sounds normal. No respiratory distress.  Abdominal: Soft. He exhibits no distension. There is tenderness (suprapubic). There is guarding (voluntary). There is no rebound.  Right CVA tenderness  Musculoskeletal: Normal range of motion.  Neurological: He is alert and oriented to person, place, and time.  Skin: Skin is warm and dry.  Psychiatric: He has a normal mood and affect. His behavior  is normal.    ED Course  Procedures (including critical care time)  Medications  sodium chloride 0.9 % bolus 1,000 mL (not administered)  ondansetron (ZOFRAN) injection 4 mg (not administered)  morphine 4 MG/ML injection 6 mg (not administered)    DIAGNOSTIC STUDIES: Oxygen Saturation is 98% on room air, normal by my interpretation.    COORDINATION OF CARE: 2:10 PM-Discussed treatment plan which includes medications, CBC panel, BMP and UA with pt at bedside and pt agreed to plan.   3:27 PM-Pt rechecked and still reports pain. Will order another round of medications.  Labs Reviewed - No data to display No results found.  1. Urinary retention     MDM  Urinary retention. Foley placed. Urology FU.   I personally preformed the services scribed in my presence. The recorded information has been reviewed is accurate. Raeford Razor, MD.   Raeford Razor, MD 12/27/12 [phone]

## 2012-12-27 NOTE — ED Notes (Signed)
Painful urination and difficulty urinating x 2 days with lower abd pain radiating to lower back.

## 2012-12-27 NOTE — ED Notes (Signed)
Patient given discharge instruction, verbalized understand. Patient  out of the department in wheelchair.

## 2012-12-31 ENCOUNTER — Emergency Department (HOSPITAL_COMMUNITY)
Admission: EM | Admit: 2012-12-31 | Discharge: 2012-12-31 | Disposition: A | Discharge status: Home / Self Care | Payer: Medicare Other | Attending: Emergency Medicine | Admitting: Emergency Medicine

## 2012-12-31 ENCOUNTER — Encounter (HOSPITAL_COMMUNITY): Payer: Self-pay

## 2012-12-31 DIAGNOSIS — Z79899 Other long term (current) drug therapy: Secondary | ICD-10-CM | POA: Diagnosis not present

## 2012-12-31 DIAGNOSIS — N400 Enlarged prostate without lower urinary tract symptoms: Secondary | ICD-10-CM | POA: Diagnosis not present

## 2012-12-31 DIAGNOSIS — E7800 Pure hypercholesterolemia, unspecified: Secondary | ICD-10-CM | POA: Diagnosis not present

## 2012-12-31 DIAGNOSIS — Z7982 Long term (current) use of aspirin: Secondary | ICD-10-CM | POA: Insufficient documentation

## 2012-12-31 DIAGNOSIS — I2510 Atherosclerotic heart disease of native coronary artery without angina pectoris: Secondary | ICD-10-CM | POA: Insufficient documentation

## 2012-12-31 DIAGNOSIS — R339 Retention of urine, unspecified: Secondary | ICD-10-CM | POA: Diagnosis not present

## 2012-12-31 NOTE — ED Notes (Signed)
Urinary retention. Cath removed today @ 4pm @ PMD's office. Has not voided since. bladder distended and painful

## 2012-12-31 NOTE — ED Notes (Signed)
Pt reports having catheter placed in department on Thurs.  Pt saw PCP today to have cath removed. Pt states he hasn't urinated since cath removal.

## 2013-01-01 ENCOUNTER — Other Ambulatory Visit (HOSPITAL_COMMUNITY): Payer: Self-pay | Admitting: Urology

## 2013-01-01 DIAGNOSIS — N400 Enlarged prostate without lower urinary tract symptoms: Secondary | ICD-10-CM | POA: Diagnosis not present

## 2013-01-01 DIAGNOSIS — R339 Retention of urine, unspecified: Secondary | ICD-10-CM | POA: Diagnosis not present

## 2013-01-03 ENCOUNTER — Ambulatory Visit (HOSPITAL_COMMUNITY)
Admission: RE | Admit: 2013-01-03 | Discharge: 2013-01-03 | Disposition: A | Discharge status: Home / Self Care | Payer: Medicare Other | Source: Ambulatory Visit | Attending: Urology | Admitting: Urology

## 2013-01-03 DIAGNOSIS — R9389 Abnormal findings on diagnostic imaging of other specified body structures: Secondary | ICD-10-CM | POA: Insufficient documentation

## 2013-01-03 DIAGNOSIS — N289 Disorder of kidney and ureter, unspecified: Secondary | ICD-10-CM | POA: Diagnosis not present

## 2013-01-03 DIAGNOSIS — R3916 Straining to void: Secondary | ICD-10-CM | POA: Insufficient documentation

## 2013-01-03 DIAGNOSIS — N400 Enlarged prostate without lower urinary tract symptoms: Secondary | ICD-10-CM

## 2013-01-03 NOTE — ED Provider Notes (Signed)
CSN: [id number]     Arrival date & time 12/31/12  2116 History     First MD Initiated Contact with Patient 12/31/12 7163    67 year old male with urinary retention. Patient was recently evaluated by myself for the same. Get a Foley catheter placed at that time. He followed up his PCPs office today. The catheter was removed. Since it was removed she's been having increasing lower bowel discomfort. He has been unable to void. Is having the same symptoms he was having previously. He was given urology followup information  But has not contacted them at this point. No other complaints. He has not noted any blood in his urine since he was last evaluated in the ED. Fevers or chills. No recent medication changes. Patient had another urinary catheter placed just before my evaluation today. Reports symptoms much improved.    Chief Complaint  Patient presents with  . Urinary Retention   (Consider location/radiation/quality/duration/timing/severity/associated sxs/prior Treatment) HPI  Past Medical History  Diagnosis Date  . Coronary artery disease   . High cholesterol    Past Surgical History  Procedure Laterality Date  . Cardiac surgery    . Hernia repair     No family history on file. History  Substance Use Topics  . Smoking status: Never Smoker   . Smokeless tobacco: Not on file  . Alcohol Use: No    Review of Systems  All systems reviewed and negative, other than as noted in HPI.   Allergies  Other  Home Medications   Current Outpatient Rx  Name  Route  Sig  Dispense  Refill  . aspirin EC 81 MG tablet   Oral   Take 81 mg by mouth daily.         Marland Kitchen LORazepam (ATIVAN) 1 MG tablet   Oral   Take 1 mg by mouth every 8 (eight) hours.         . metoprolol (LOPRESSOR) 100 MG tablet   Oral   Take 100 mg by mouth daily.         . naproxen sodium (ALEVE) 220 MG tablet   Oral   Take 220 mg by mouth 2 (two) times daily as needed.         . nitroGLYCERIN (NITROSTAT) 0.4  MG SL tablet   Sublingual   Place 0.4 mg under the tongue every 5 (five) minutes as needed for chest pain.         . ranitidine (ZANTAC) 150 MG tablet   Oral   Take 150 mg by mouth daily as needed for heartburn.         . simvastatin (ZOCOR) 40 MG tablet   Oral   Take 40 mg by mouth at bedtime.         . trandolapril (MAVIK) 2 MG tablet   Oral   Take 2 mg by mouth daily.          BP 137/84  Pulse 70  Temp(Src) 98 F (36.7 C) (Oral)  Resp 18  SpO2 98% Physical Exam  Nursing note and vitals reviewed. Constitutional: He appears well-developed and well-nourished. No distress.  HENT:  Head: Normocephalic and atraumatic.  Eyes: Conjunctivae are normal. Right eye exhibits no discharge. Left eye exhibits no discharge.  Neck: Neck supple.  Cardiovascular: Normal rate, regular rhythm and normal heart sounds.  Exam reveals no gallop and no friction rub.   No murmur heard. Pulmonary/Chest: Effort normal and breath sounds normal. No respiratory distress.  Abdominal: Soft.  He exhibits no distension. There is no tenderness.  Genitourinary:  Foley catheter in place. Appears to be draining well. Urine is clear.  Musculoskeletal: He exhibits no edema and no tenderness.  Neurological: He is alert.  Skin: Skin is warm and dry.  Psychiatric: He has a normal mood and affect. His behavior is normal. Thought content normal.    ED Course   Procedures (including critical care time)  Labs Reviewed - No data to display No results found. 1. Urinary retention     MDM  67 year old male with urinary retention. Catheter was replaced. Patient reports relief of symptoms. Patient states that she is given a prescription for Flomax by his primary care provider today. Understands the need for urologic followup.  Raeford Razor, MD 01/03/13 [phone]

## 2013-01-09 DIAGNOSIS — R972 Elevated prostate specific antigen [PSA]: Secondary | ICD-10-CM | POA: Diagnosis not present

## 2013-01-09 DIAGNOSIS — R3919 Other difficulties with micturition: Secondary | ICD-10-CM | POA: Diagnosis not present

## 2013-01-14 DIAGNOSIS — N400 Enlarged prostate without lower urinary tract symptoms: Secondary | ICD-10-CM | POA: Diagnosis not present

## 2013-01-14 DIAGNOSIS — R339 Retention of urine, unspecified: Secondary | ICD-10-CM | POA: Diagnosis not present

## 2013-01-16 ENCOUNTER — Encounter (HOSPITAL_COMMUNITY): Payer: Self-pay | Admitting: Pharmacy Technician

## 2013-01-22 NOTE — Patient Instructions (Addendum)
Connor [name]  01/22/2013   Your procedure is scheduled on:  01/29/2013  Report to [facility] at  900  AM.  Call this number if you have problems the morning of surgery: [phone]   Remember:   Do not eat food or drink liquids after midnight.   Take these medicines the morning of surgery with A SIP OF WATER: ativan, metoprolol, zantac, mavik   Do not wear jewelry, make-up or nail polish.  Do not wear lotions, powders, or perfumes.   Do not shave 48 hours prior to surgery. Men may shave face and neck.  Do not bring valuables to the hospital.  [facility] is not responsible  for any belongings or valuables.  Contacts, dentures or bridgework may not be worn into surgery.  Leave suitcase in the car. After surgery it may be brought to your room.  For patients admitted to the hospital, checkout time is 11:00 AM the day of discharge.   Patients discharged the day of surgery will not be allowed to drive home.  Name and phone number of your driver: family  Special Instructions: Shower using CHG 2 nights before surgery and the night before surgery.  If you shower the day of surgery use CHG.  Use special wash - you have one bottle of CHG for all showers.  You should use approximately 1/3 of the bottle for each shower.   Please read over the following fact sheets that you were given: Pain Booklet, Coughing and Deep Breathing, MRSA Information, Surgical Site Infection Prevention, Anesthesia Post-op Instructions and Care and Recovery After Surgery Transurethral Resection of the Prostate Transurethral resection of the prostate (TURP) is the removal of part of your prostate to treat noncancerous (benign) prostatic hyperplasia (BPH). BPH typically occurs in men older than 40 years. It is the abnormal growth of cells in your prostate. Specifically, it is an abnormal increase in the number of cells that make up your prostate tissue. This causes an increase in the size of your prostate. Often, in the case  of BPH, the prostate becomes so large that it compresses the tube that drains urine out of your body from your bladder (urethra). Eventually, this compression can obstruct the flow of urine from your bladder. This obstruction can cause recurrent bladder infection and difficulties with bladder control and bladder emptying. The goal of TURP is to remove enough prostate tissue to allow for an unobstructed flow of urine, which often resolves the associated conditions. LET YOUR CAREGIVER KNOW ABOUT:  Any allergies you have.  Any medicines you are taking, including herbs, eye drops, over-the-counter medicines, and creams.  Any problems you have had with the use of anesthetics.  Any blood disorders you have, including bleeding problems and clotting problems.  Previous surgeries you have had.  Any prostate infections you have had. RISKS AND COMPLICATIONS Generally, TURP is a safe procedure. However, as with any surgical procedure, complications can occur. Possible complications associated with TURP include:  Difficulty getting an erection.  Scarring, which may cause problems with the flow of your urine.  Injury to your urethra.  Incontinence from injury to the muscle that surrounds your prostate, which controls urine flow.  Infection.  Bleeding.  Injury to your bladder (rare). BEFORE THE PROCEDURE  Your caregiver will tell you when you need to stop eating and drinking. If you take any medicines, your caregiver will tell you which ones you may keep taking and which ones you will have to  stop taking and when.  Just before the procedure you will also receive medicine to make you fall asleep (general anesthetic). This will be given through a tube that is inserted into one of your veins (intravenous [IV] tube). PROCEDURE Your surgeon inserts an instrument that is similar to a telescope with an electric cutting edge (resectoscope) through your urethra to the area of the prostate gland. The  cutting edge is used to remove enlarged pieces of your prostate, one piece at a time. At the end of your procedure, a flexible tube (catheter) will be inserted into your urethra to drain your bladder. Special plastic bags filled with solution will be connected to the end of the catheter. The solution will be used to irrigate blood from your bladder while you heal.  AFTER THE PROCEDURE You will be taken to the recovery area. Once you are awake, stable, and taking fluids well, you will be taken to your hospital room. Typically, you will stay in the hospital 1 2 days after this procedure. The catheter usually is removed before discharge from the hospital. Document Released: 05/23/2005 Document Revised: 02/15/2012 Document Reviewed: 10/24/2011 [facility] Patient Information 2014 [name], [place]. PATIENT INSTRUCTIONS POST-ANESTHESIA  IMMEDIATELY FOLLOWING SURGERY:  Do not drive or operate machinery for the first twenty four hours after surgery.  Do not make any important decisions for twenty four hours after surgery or while taking narcotic pain medications or sedatives.  If you develop intractable nausea and vomiting or a severe headache please notify your doctor immediately.  FOLLOW-UP:  Please make an appointment with your surgeon as instructed. You do not need to follow up with anesthesia unless specifically instructed to do so.  WOUND CARE INSTRUCTIONS (if applicable):  Keep a dry clean dressing on the anesthesia/puncture wound site if there is drainage.  Once the wound has quit draining you may leave it open to air.  Generally you should leave the bandage intact for twenty four hours unless there is drainage.  If the epidural site drains for more than 36-48 hours please call the anesthesia department.  QUESTIONS?:  Please feel free to call your physician or the hospital operator if you have any questions, and they will be happy to assist you.

## 2013-01-23 ENCOUNTER — Encounter (HOSPITAL_COMMUNITY)
Admission: RE | Admit: 2013-01-23 | Discharge: 2013-01-23 | Disposition: A | Discharge status: Home / Self Care | Payer: Medicare Other | Source: Ambulatory Visit | Attending: Urology | Admitting: Urology

## 2013-01-23 ENCOUNTER — Other Ambulatory Visit: Payer: Self-pay

## 2013-01-23 ENCOUNTER — Encounter (HOSPITAL_COMMUNITY): Payer: Self-pay

## 2013-01-23 DIAGNOSIS — Z01810 Encounter for preprocedural cardiovascular examination: Secondary | ICD-10-CM | POA: Diagnosis not present

## 2013-01-23 DIAGNOSIS — Z01812 Encounter for preprocedural laboratory examination: Secondary | ICD-10-CM | POA: Insufficient documentation

## 2013-01-23 HISTORY — DX: Gastro-esophageal reflux disease without esophagitis: K21.9

## 2013-01-23 HISTORY — DX: Anxiety disorder, unspecified: F41.9

## 2013-01-23 HISTORY — DX: Benign prostatic hyperplasia without lower urinary tract symptoms: N40.0

## 2013-01-23 HISTORY — DX: Essential (primary) hypertension: I10

## 2013-01-23 HISTORY — DX: Pure hypercholesterolemia, unspecified: E78.00

## 2013-01-23 LAB — BASIC METABOLIC PANEL
CO2: 28 mEq/L (ref 19–32)
Calcium: 9.9 mg/dL (ref 8.4–10.5)
Creatinine, Ser: 1.02 mg/dL (ref 0.50–1.35)
GFR calc Af Amer: 85 mL/min — ABNORMAL LOW (ref >90)
Sodium: 138 mEq/L (ref 135–145)

## 2013-01-23 LAB — HEMOGLOBIN AND HEMATOCRIT, BLOOD
HCT: 45.7 % (ref 39.0–52.0)
Hemoglobin: 15.8 g/dL (ref 13.0–17.0)

## 2013-01-29 ENCOUNTER — Encounter (HOSPITAL_COMMUNITY)
Admission: RE | Disposition: A | Discharge status: Home / Self Care | Payer: Self-pay | Source: Ambulatory Visit | Attending: Urology

## 2013-01-29 ENCOUNTER — Encounter (HOSPITAL_COMMUNITY): Payer: Self-pay | Admitting: Anesthesiology

## 2013-01-29 ENCOUNTER — Observation Stay (HOSPITAL_COMMUNITY)
Admission: RE | Admit: 2013-01-29 | Discharge: 2013-01-31 | Disposition: A | Discharge status: Home / Self Care | Payer: Medicare Other | Source: Ambulatory Visit | Attending: Urology | Admitting: Urology

## 2013-01-29 ENCOUNTER — Ambulatory Visit (HOSPITAL_COMMUNITY): Payer: Medicare Other | Admitting: Anesthesiology

## 2013-01-29 ENCOUNTER — Encounter (HOSPITAL_COMMUNITY): Payer: Self-pay | Admitting: *Deleted

## 2013-01-29 DIAGNOSIS — N138 Other obstructive and reflux uropathy: Secondary | ICD-10-CM | POA: Diagnosis not present

## 2013-01-29 DIAGNOSIS — N400 Enlarged prostate without lower urinary tract symptoms: Secondary | ICD-10-CM | POA: Diagnosis not present

## 2013-01-29 DIAGNOSIS — R112 Nausea with vomiting, unspecified: Secondary | ICD-10-CM | POA: Diagnosis not present

## 2013-01-29 DIAGNOSIS — Z01812 Encounter for preprocedural laboratory examination: Secondary | ICD-10-CM | POA: Insufficient documentation

## 2013-01-29 DIAGNOSIS — N401 Enlarged prostate with lower urinary tract symptoms: Secondary | ICD-10-CM | POA: Insufficient documentation

## 2013-01-29 DIAGNOSIS — N4289 Other specified disorders of prostate: Secondary | ICD-10-CM | POA: Diagnosis not present

## 2013-01-29 DIAGNOSIS — R339 Retention of urine, unspecified: Secondary | ICD-10-CM | POA: Insufficient documentation

## 2013-01-29 DIAGNOSIS — N411 Chronic prostatitis: Secondary | ICD-10-CM | POA: Diagnosis not present

## 2013-01-29 HISTORY — PX: TRANSURETHRAL RESECTION OF PROSTATE: SHX73

## 2013-01-29 SURGERY — TURP (TRANSURETHRAL RESECTION OF PROSTATE)
Anesthesia: Spinal | Wound class: Clean Contaminated

## 2013-01-29 MED ORDER — MIDAZOLAM HCL 5 MG/5ML IJ SOLN
INTRAMUSCULAR | Status: DC | PRN
  Administered 2013-01-29 (×2): 1 mg via INTRAVENOUS

## 2013-01-29 MED ORDER — FENTANYL CITRATE 0.05 MG/ML IJ SOLN
INTRAMUSCULAR | Status: DC | PRN
  Administered 2013-01-29: 25 ug via INTRATHECAL

## 2013-01-29 MED ORDER — LACTATED RINGERS IV SOLN
INTRAVENOUS | Status: DC
  Administered 2013-01-29 (×2): via INTRAVENOUS

## 2013-01-29 MED ORDER — ONDANSETRON HCL 4 MG/2ML IJ SOLN
INTRAMUSCULAR | Status: AC
  Administered 2013-01-29: 4 mg via INTRAVENOUS
  Filled 2013-01-29: qty 2

## 2013-01-29 MED ORDER — MIDAZOLAM HCL 2 MG/2ML IJ SOLN
INTRAMUSCULAR | Status: AC
  Filled 2013-01-29: qty 2

## 2013-01-29 MED ORDER — FENTANYL CITRATE 0.05 MG/ML IJ SOLN
INTRAMUSCULAR | Status: AC
  Filled 2013-01-29: qty 2

## 2013-01-29 MED ORDER — SODIUM CHLORIDE 0.9 % IR SOLN
3000.0000 mL | Status: DC
  Administered 2013-01-29 – 2013-01-30 (×4): 3000 mL

## 2013-01-29 MED ORDER — CIPROFLOXACIN IN D5W 200 MG/100ML IV SOLN
INTRAVENOUS | Status: AC
  Filled 2013-01-29: qty 100

## 2013-01-29 MED ORDER — STERILE WATER FOR IRRIGATION IR SOLN
Status: DC | PRN
  Administered 2013-01-29: 1000 mL

## 2013-01-29 MED ORDER — BUPIVACAINE IN DEXTROSE 0.75-8.25 % IT SOLN
INTRATHECAL | Status: AC
  Filled 2013-01-29: qty 2

## 2013-01-29 MED ORDER — PROPOFOL INFUSION 10 MG/ML OPTIME
INTRAVENOUS | Status: DC | PRN
  Administered 2013-01-29: 30 ug/kg/min via INTRAVENOUS

## 2013-01-29 MED ORDER — MIDAZOLAM HCL 2 MG/2ML IJ SOLN
1.0000 mg | INTRAMUSCULAR | Status: DC | PRN
Start: 2013-01-29 — End: 2013-01-29
  Administered 2013-01-29: 2 mg via INTRAVENOUS

## 2013-01-29 MED ORDER — CIPROFLOXACIN IN D5W 200 MG/100ML IV SOLN
200.0000 mg | INTRAVENOUS | Status: DC
  Administered 2013-01-30: 200 mg via INTRAVENOUS
  Filled 2013-01-29 (×2): qty 100

## 2013-01-29 MED ORDER — PROPOFOL 10 MG/ML IV EMUL
INTRAVENOUS | Status: AC
  Filled 2013-01-29: qty 20

## 2013-01-29 MED ORDER — ONDANSETRON HCL 4 MG/2ML IJ SOLN
4.0000 mg | Freq: Four times a day (QID) | INTRAMUSCULAR | Status: DC | PRN
  Administered 2013-01-29 – 2013-01-30 (×2): 4 mg via INTRAVENOUS
  Filled 2013-01-29: qty 2

## 2013-01-29 MED ORDER — SODIUM CHLORIDE 0.9 % IR SOLN
3000.0000 mL | Status: DC | PRN
  Administered 2013-01-29 (×4): 3000 mL

## 2013-01-29 MED ORDER — METOPROLOL TARTRATE 50 MG PO TABS
100.0000 mg | ORAL_TABLET | Freq: Every day | ORAL | Status: DC
  Administered 2013-01-30 – 2013-01-31 (×2): 100 mg via ORAL
  Filled 2013-01-29 (×2): qty 2

## 2013-01-29 MED ORDER — NAPROXEN SODIUM 220 MG PO TABS: 220.0000 mg | ORAL_TABLET | Freq: Two times a day (BID) | ORAL | Status: DC | PRN

## 2013-01-29 MED ORDER — GLYCINE 1.5 % IR SOLN
Status: DC | PRN
  Administered 2013-01-29 (×2): 3000 mL

## 2013-01-29 MED ORDER — HYDROMORPHONE HCL PF 1 MG/ML IJ SOLN
0.5000 mg | INTRAMUSCULAR | Status: DC | PRN
  Administered 2013-01-29 – 2013-01-30 (×7): 0.5 mg via INTRAVENOUS
  Filled 2013-01-29 (×8): qty 1

## 2013-01-29 MED ORDER — PROPOFOL INFUSION 10 MG/ML OPTIME: INTRAVENOUS | Status: DC | PRN

## 2013-01-29 MED ORDER — CIPROFLOXACIN IN D5W 200 MG/100ML IV SOLN
200.0000 mg | Freq: Two times a day (BID) | INTRAVENOUS | Status: DC
  Administered 2013-01-29: 200 mg via INTRAVENOUS

## 2013-01-29 MED ORDER — ONDANSETRON HCL 4 MG/2ML IJ SOLN: 4.0000 mg | Freq: Once | INTRAMUSCULAR | Status: DC | PRN

## 2013-01-29 MED ORDER — BUPIVACAINE IN DEXTROSE 0.75-8.25 % IT SOLN
INTRATHECAL | Status: DC | PRN
  Administered 2013-01-29: 15 mg via INTRATHECAL

## 2013-01-29 MED ORDER — NITROGLYCERIN 0.4 MG SL SUBL: 0.4000 mg | SUBLINGUAL_TABLET | SUBLINGUAL | Status: DC | PRN

## 2013-01-29 MED ORDER — EPHEDRINE SULFATE 50 MG/ML IJ SOLN
INTRAMUSCULAR | Status: DC | PRN
  Administered 2013-01-29 (×3): 10 mg via INTRAVENOUS

## 2013-01-29 MED ORDER — FAMOTIDINE 20 MG PO TABS
10.0000 mg | ORAL_TABLET | Freq: Every day | ORAL | Status: DC
  Administered 2013-01-29 – 2013-01-31 (×3): 10 mg via ORAL
  Filled 2013-01-29 (×3): qty 1

## 2013-01-29 MED ORDER — LORAZEPAM 1 MG PO TABS
1.0000 mg | ORAL_TABLET | Freq: Three times a day (TID) | ORAL | Status: DC
  Administered 2013-01-29 – 2013-01-31 (×5): 1 mg via ORAL
  Filled 2013-01-29 (×5): qty 1

## 2013-01-29 MED ORDER — TRANDOLAPRIL 2 MG PO TABS
2.0000 mg | ORAL_TABLET | Freq: Every day | ORAL | Status: DC
  Administered 2013-01-30 – 2013-01-31 (×2): 2 mg via ORAL
  Filled 2013-01-29 (×2): qty 1

## 2013-01-29 MED ORDER — SIMVASTATIN 20 MG PO TABS
40.0000 mg | ORAL_TABLET | Freq: Every day | ORAL | Status: DC
  Administered 2013-01-29 – 2013-01-30 (×2): 40 mg via ORAL
  Filled 2013-01-29 (×2): qty 2

## 2013-01-29 MED ORDER — FENTANYL CITRATE 0.05 MG/ML IJ SOLN
25.0000 ug | INTRAMUSCULAR | Status: AC
  Administered 2013-01-29 (×2): 25 ug via INTRAVENOUS

## 2013-01-29 MED ORDER — EPHEDRINE SULFATE 50 MG/ML IJ SOLN
INTRAMUSCULAR | Status: AC
  Filled 2013-01-29: qty 1

## 2013-01-29 MED ORDER — NAPROXEN 250 MG PO TABS: 250.0000 mg | ORAL_TABLET | Freq: Two times a day (BID) | ORAL | Status: DC | PRN

## 2013-01-29 MED ORDER — FENTANYL CITRATE 0.05 MG/ML IJ SOLN: 25.0000 ug | INTRAMUSCULAR | Status: DC | PRN

## 2013-01-29 MED ORDER — GLYCINE 1.5 % IR SOLN
Status: DC | PRN
  Administered 2013-01-29 (×11): 3000 mL

## 2013-01-29 MED ORDER — LIDOCAINE HCL (PF) 1 % IJ SOLN
INTRAMUSCULAR | Status: AC
  Filled 2013-01-29: qty 5

## 2013-01-29 SURGICAL SUPPLY — 35 items
BAG DECANTER FOR FLEXI CONT (MISCELLANEOUS) ×2 IMPLANT
BAG DRAIN URO TABLE W/ADPT NS (DRAPE) ×2 IMPLANT
BAG URINE DRAIN TURP 4L (OSTOMY) ×2 IMPLANT
CABLE HI FREQUENCY MONOPOLAR (ELECTROSURGICAL) ×2 IMPLANT
CATH FOLEY 3WAY 30CC 22F (CATHETERS) ×2 IMPLANT
CLOTH BEACON ORANGE TIMEOUT ST (SAFETY) ×2 IMPLANT
CONNECTOR 5 IN 1 STRAIGHT STRL (MISCELLANEOUS) ×2 IMPLANT
DRAPE STERI URO 23X35 APER SZ5 (DRAPE) ×2 IMPLANT
DRAPE WARM FLUID 44X44 (DRAPE) ×2 IMPLANT
ELECT CUT LOOP C-MAX 27FR .012 (CUTTING LOOP) ×2
ELECT REM PT RETURN 9FT ADLT (ELECTROSURGICAL) ×2
ELECTRODE CUT LP CMX 27FR .012 (CUTTING LOOP) ×1 IMPLANT
ELECTRODE REM PT RTRN 9FT ADLT (ELECTROSURGICAL) ×1 IMPLANT
FLOOR PAD 36X40 (MISCELLANEOUS) ×2
FORMALIN 10 PREFIL 480ML (MISCELLANEOUS) ×2 IMPLANT
GLOVE BIO SURGEON STRL SZ7 (GLOVE) ×2 IMPLANT
GLOVE BIOGEL PI IND STRL 7.0 (GLOVE) ×1 IMPLANT
GLOVE BIOGEL PI INDICATOR 7.0 (GLOVE) ×1
GLOVE EXAM NITRILE MD LF STRL (GLOVE) ×2 IMPLANT
GLOVE SS BIOGEL STRL SZ 6.5 (GLOVE) ×1 IMPLANT
GLOVE SUPERSENSE BIOGEL SZ 6.5 (GLOVE) ×1
GLYCINE 1.5% IRRIG UROMATIC (IV SOLUTION) ×26 IMPLANT
GOWN STRL REIN XL XLG (GOWN DISPOSABLE) ×2 IMPLANT
IV NS IRRIG 3000ML ARTHROMATIC (IV SOLUTION) ×2 IMPLANT
KIT ROOM TURNOVER AP CYSTO (KITS) ×2 IMPLANT
MANIFOLD NEPTUNE II (INSTRUMENTS) ×2 IMPLANT
PACK CYSTO (CUSTOM PROCEDURE TRAY) ×2 IMPLANT
PAD ARMBOARD 7.5X6 YLW CONV (MISCELLANEOUS) ×2 IMPLANT
PAD FLOOR 36X40 (MISCELLANEOUS) ×1 IMPLANT
SET IRRIGATING DISP (SET/KITS/TRAYS/PACK) ×2 IMPLANT
SYR 30ML LL (SYRINGE) ×2 IMPLANT
TOWEL OR 17X26 4PK STRL BLUE (TOWEL DISPOSABLE) ×2 IMPLANT
WATER STERILE IRR 1000ML POUR (IV SOLUTION) ×2 IMPLANT
XPEEDA 550 SIDEFIRING FIBER (MISCELLANEOUS) IMPLANT
YANKAUER SUCT BULB TIP 10FT TU (MISCELLANEOUS) ×2 IMPLANT

## 2013-01-29 NOTE — Brief Op Note (Signed)
01/29/2013  12:21 PM  PATIENT:  Connor [name]  67 y.o. male  PRE-OPERATIVE DIAGNOSIS:  benign prostatic hypertrophy, acute urinary retention  POST-OPERATIVE DIAGNOSIS:  benign prostatic hypertrophy, acute urinary retention  PROCEDURE:  Procedure(s): TRANSURETHRAL RESECTION OF THE PROSTATE (TURP) (N/A)  SURGEON:  Surgeon(s) and Role:    * Ky Barban, MD - Primary  PHYSICIAN ASSISTANT:   ASSISTANTS: none   ANESTHESIA:   spinal  EBL:  Total I/O In: 1000 [I.V.:1000] Out: 250 [Urine:250]  BLOOD ADMINISTERED:none  DRAINS: Urinary Catheter (Foley)   LOCAL MEDICATIONS USED:  NONE  SPECIMEN:  Source of Specimen:  prostate chips  DISPOSITION OF SPECIMEN:  PATHOLOGY  COUNTS:  YES  TOURNIQUET:  * No tourniquets in log *  DICTATION: .Other Dictation: Dictation Number dictation [phone] [phone]  PLAN OF CARE: Admit for overnight observation  PATIENT DISPOSITION:  PACU - hemodynamically stable.   Delay start of Pharmacological VTE agent (>24hrs) due to surgical blood loss or risk of bleeding:

## 2013-01-29 NOTE — Anesthesia Postprocedure Evaluation (Signed)
  Anesthesia Post-op Note  Patient: Connor [name]  Procedure(s) Performed: Procedure(s): TRANSURETHRAL RESECTION OF THE PROSTATE (TURP) (N/A)  Patient Location: PACU  Anesthesia Type:Spinal  Level of Consciousness: awake, alert  and oriented  Airway and Oxygen Therapy: Patient Spontanous Breathing  Post-op Pain: none  Post-op Assessment: Post-op Vital signs reviewed, Patient's Cardiovascular Status Stable, Respiratory Function Stable, Patent Airway and No signs of Nausea or vomiting  Post-op Vital Signs: Reviewed and stable  Complications: No apparent anesthesia complications

## 2013-01-29 NOTE — Anesthesia Preprocedure Evaluation (Addendum)
Anesthesia Evaluation  Patient identified by MRN, date of birth, ID band Patient awake    Reviewed: Allergy & Precautions, H&P , NPO status , Patient's Chart, lab work & pertinent test results, reviewed documented beta blocker date and time   Airway Mallampati: II TM Distance: <3 FB Neck ROM: Full    Dental  (+) Edentulous Upper and Partial Lower   Pulmonary          Cardiovascular hypertension, Pt. on medications + CAD, + Cardiac Stents and + DOE Rhythm:Regular Rate:Bradycardia     Neuro/Psych PSYCHIATRIC DISORDERS Anxiety    GI/Hepatic GERD-  Medicated and Controlled,  Endo/Other    Renal/GU      Musculoskeletal   Abdominal   Peds  Hematology   Anesthesia Other Findings   Reproductive/Obstetrics                          Anesthesia Physical Anesthesia Plan  ASA: III  Anesthesia Plan: Spinal   Post-op Pain Management:    Induction:   Airway Management Planned: Nasal Cannula  Additional Equipment:   Intra-op Plan:   Post-operative Plan:   Informed Consent: I have reviewed the patients History and Physical, chart, labs and discussed the procedure including the risks, benefits and alternatives for the proposed anesthesia with the patient or authorized representative who has indicated his/her understanding and acceptance.     Plan Discussed with:   Anesthesia Plan Comments: (Marcaine / propofol sedation)        Anesthesia Quick Evaluation

## 2013-01-29 NOTE — Progress Notes (Signed)
No change in H&P on reexamination. 

## 2013-01-29 NOTE — H&P (Signed)
NAME:  Connor [name], Connor [name] NO.:  [id number]  MEDICAL RECORD NO.:  [id number]  LOCATION:                               FACILITY:  [facility]  PHYSICIAN:  Connor [name], M.D.DATE OF BIRTH:  1946-01-15  DATE OF ADMISSION:  01/29/2013 DATE OF DISCHARGE:  08/28/2014LH                             HISTORY & PHYSICAL   CHIEF COMPLAINT:  Acute urinary retention.  HISTORY:  A 67 year old gentleman who went to the emergency room where a Foley catheter was inserted and he was unable to void.  He was referred over here, and he has a longstanding history of prostatism.  Ten years ago he had cardiac cath and went into retention, but subsequently he was able to void.  He has no history of gross hematuria, fever, chills or any other urological problems.  PAST MEDICAL HISTORY:  No history of diabetes or hypertension.  Does have a coronary artery stent for the last 10 years.  Does not take any anticoagulation, just takes aspirin.  PERSONAL HISTORY:  He does not smoke or drink.  REVIEW OF SYSTEMS:  Unremarkable.  FAMILY HISTORY:  No history of prostate cancer.  ALLERGIES:  He is allergic to DEMEROL.  SURGICAL HISTORY:  A right inguinal hernia repair several years ago.  PHYSICAL EXAMINATION:  GENERAL:  Moderately built male, not in acute distress, fully conscious, alert, oriented. VITAL SIGNS:  Blood pressure 130/80, temperature is normal. CENTRAL NERVOUS SYSTEM:  No gross neurological deficit. Head, neck, eyes, ENT :  Negative. CHEST:  Symmetrical.  Normal breath sounds. HEART:  Regular sinus rhythm.  No murmur. ABDOMEN:  Soft, flat.  Liver, spleen, kidneys not palpable.  No CVA tenderness. EXTERNAL GENITALIA:  There is Foley catheter in place.  Testicles are normal. RECTAL:  Prostate is 40 g, smooth and firm.  Nontender.  IMPRESSION:  Benign prostatic hyperplasia with bladder neck obstruction. He had a CT scan done which is essentially negative.  Cystoscopy  shows trilobar hypertrophy of the bladder neck obstruction.  PSA is slightly elevated could be because of the catheter and retention, it is 6.2.  The patient had a systematic done, they did not generate any voiding pressure, so I told them that we need to do TUR prostate only then we will know whether he will be able to void or not.  PLAN:  Connor [name] I did a cystoscopy he was able to tell me about proprioception and sensation to void.  So, I have not given him any guarantees about the result.  We will do a TUR prostate, keep him overnight, and go home with Foley catheter.     Connor [name], M.D.     MIJ/MEDQ  D:  01/28/2013  T:  01/29/2013  Job:  [id number]

## 2013-01-29 NOTE — Anesthesia Procedure Notes (Signed)
Spinal  Patient location during procedure: OR Start time: 01/29/2013 10:39 AM Staffing CRNA/Resident: Glynn Octave E Preanesthetic Checklist Completed: patient identified, site marked, surgical consent, pre-op evaluation, timeout performed, IV checked, risks and benefits discussed and monitors and equipment checked Spinal Block Patient position: left lateral decubitus Prep: Betadine Patient monitoring: heart rate, cardiac monitor, continuous pulse ox and blood pressure Approach: left paramedian Location: L3-4 Injection technique: single-shot Needle Needle type: Spinocan  Needle gauge: 22 G Needle length: 9 cm Assessment Sensory level: T8 Additional Notes  ATTEMPTS:1 TRAY ZO:[id number] TRAY EXPIRATION DATE:05/2013

## 2013-01-29 NOTE — H&P (Deleted)
NAME:  Connor [name], Connor [name]                 ACCOUNT NO.:  [id number]  MEDICAL RECORD NO.:  [id number]  LOCATION:                               FACILITY:  [facility]  PHYSICIAN:  Mohammad I. Jeannette Maddy, M.D.DATE OF BIRTH:  07/10/1945  DATE OF ADMISSION:  01/29/2013 DATE OF DISCHARGE:  08/28/2014LH                             HISTORY & PHYSICAL   CHIEF COMPLAINT:  Acute urinary retention.  HISTORY:  A 67-year-old gentleman who went to the emergency room where a Foley catheter was inserted and he was unable to void.  He was referred over here, and he has a longstanding history of prostatism.  Ten years ago he had cardiac cath and went into retention, but subsequently he was able to void.  He has no history of gross hematuria, fever, chills or any other urological problems.  PAST MEDICAL HISTORY:  No history of diabetes or hypertension.  Does have a coronary artery stent for the last 10 years.  Does not take any anticoagulation, just takes aspirin.  PERSONAL HISTORY:  He does not smoke or drink.  REVIEW OF SYSTEMS:  Unremarkable.  FAMILY HISTORY:  No history of prostate cancer.  ALLERGIES:  He is allergic to DEMEROL.  SURGICAL HISTORY:  A right inguinal hernia repair several years ago.  PHYSICAL EXAMINATION:  GENERAL:  Moderately built male, not in acute distress, fully conscious, alert, oriented. VITAL SIGNS:  Blood pressure 130/80, temperature is normal. CENTRAL NERVOUS SYSTEM:  No gross neurological deficit. Head, neck, eyes, ENT :  Negative. CHEST:  Symmetrical.  Normal breath sounds. HEART:  Regular sinus rhythm.  No murmur. ABDOMEN:  Soft, flat.  Liver, spleen, kidneys not palpable.  No CVA tenderness. EXTERNAL GENITALIA:  There is Foley catheter in place.  Testicles are normal. RECTAL:  Prostate is 40 g, smooth and firm.  Nontender.  IMPRESSION:  Benign prostatic hyperplasia with bladder neck obstruction. He had a CT scan done which is essentially negative.  Cystoscopy  shows trilobar hypertrophy of the bladder neck obstruction.  PSA is slightly elevated could be because of the catheter and retention, it is 6.2.  The patient had a systematic done, they did not generate any voiding pressure, so I told them that we need to do TUR prostate only then we will know whether he will be able to void or not.  PLAN:  Wen I did a cystoscopy he was able to tell me about proprioception and sensation to void.  So, I have not given him any guarantees about the result.  We will do a TUR prostate, keep him overnight, and go home with Foley catheter.     Mohammad I. Carleah Yablonski, M.D.     MIJ/MEDQ  D:  01/28/2013  T:  01/29/2013  Job:  [id number] 

## 2013-01-29 NOTE — Transfer of Care (Signed)
Immediate Anesthesia Transfer of Care Note  Patient: Connor [name]  Procedure(s) Performed: Procedure(s): TRANSURETHRAL RESECTION OF THE PROSTATE (TURP) (N/A)  Patient Location: PACU  Anesthesia Type:Spinal  Level of Consciousness: awake  Airway & Oxygen Therapy: Patient Spontanous Breathing and Patient connected to nasal cannula oxygen  Post-op Assessment: Report given to PACU RN  Post vital signs: Reviewed and stable  Complications: No apparent anesthesia complications

## 2013-01-30 ENCOUNTER — Encounter (HOSPITAL_COMMUNITY): Payer: Self-pay

## 2013-01-30 LAB — BASIC METABOLIC PANEL
BUN: 12 mg/dL (ref 6–23)
Calcium: 9.5 mg/dL (ref 8.4–10.5)
Creatinine, Ser: 1.06 mg/dL (ref 0.50–1.35)
GFR calc Af Amer: 81 mL/min — ABNORMAL LOW (ref >90)
GFR calc non Af Amer: 70 mL/min — ABNORMAL LOW (ref >90)
Glucose, Bld: 105 mg/dL — ABNORMAL HIGH (ref 70–99)
Potassium: 4.6 mEq/L (ref 3.5–5.1)

## 2013-01-30 LAB — CBC
HCT: 46 % (ref 39.0–52.0)
MCH: 30 pg (ref 26.0–34.0)
MCHC: 34.1 g/dL (ref 30.0–36.0)
RDW: 13.4 % (ref 11.5–15.5)

## 2013-01-30 NOTE — Progress Notes (Signed)
Report [phone]

## 2013-01-30 NOTE — Progress Notes (Signed)
NAME:  Connor [name], Connor [name] NO.:  [id number]  MEDICAL RECORD NO.:  [id number]  LOCATION:  A340                          FACILITY:  [facility]  PHYSICIAN:  Ky Barban, M.D.DATE OF BIRTH:  May 06, 1946  DATE OF PROCEDURE: DATE OF DISCHARGE:                                PROGRESS NOTE   Mr. Pousson had a TUR prostate yesterday.  His CBI is absolutely clear, it may be slightly pinkish.  He just vomited.  He vomited last night and that was probably because of the morphine injection.  He was getting some pain and he did not throw up until this morning until just a few minutes ago.  He is not having any abdominal pain.  No nausea, no other complaints.  CBI is clear.  He probably had a bladder spasm and abdomen is soft, nondistended, no tenderness.  His WBC count is 10.3, hematocrit is 46%.  Sodium 137, potassium 4.6, chloride 102, CO2 is 29, BUN is 20, and creatinine 1.06.  Temperature 98.4, pulse 71 per minute, respiration 20 per minute, blood pressure 147/79.  PLAN:  I am going to just observe him and keep him here tonight, not send him home today.  We will repeat his labs again tomorrow.  CBC and BMET tomorrow.     Ky Barban, M.D.     MIJ/MEDQ  D:  01/30/2013  T:  01/30/2013  Job:  [id number]

## 2013-01-30 NOTE — Op Note (Signed)
NAME:  Connor [name], Connor [name] NO.:  [id number]  MEDICAL RECORD NO.:  [id number]  LOCATION:  A340                          FACILITY:  [facility]  PHYSICIAN:  Ky Barban, M.D.DATE OF BIRTH:  [birth year]  DATE OF PROCEDURE:  01/29/2013 DATE OF DISCHARGE:                              OPERATIVE REPORT   PREOPERATIVE DIAGNOSES:  Acute urinary retention, benign prostatic hypertrophy.  POSTOPERATIVE DIAGNOSES:  Acute urinary retention, benign prostatic hypertrophy.  PROCEDURE:  Transurethral resection of the prostate.  ANESTHESIA:  Spinal.  PROCEDURE IN DETAIL:  The patient under spinal anesthesia in lithotomy position.  After usual prep and drape, flexed and the #28 Iglesias resectoscope was introduced into the bladder.  It was once again inspected.  His prostatic urethra is rather long.  He has rather bigger prostate, but I will start the TURP and see if I can remove enough tissue.  He has a trilobar hypertrophy.  There is a median lobe, which was resected up to the level of the mid prostatic urethra.  Then, the bladder neck was circumferentially resected down to the circular fibers. Resectoscope was pulled back at the label of the verumontanum, rotated to 11 o'clock position.  Resection of the right lobe was done completely between 11 and 7 o'clock position.  Similarly, the left lobe was resected between 1 and 5 o'clock position, along with the apical tissue making sure not to injure the sphincter of the verumontanum.  Prostatic urethra now looks wide open.  There is still considerable tissue in the lateral lobe.  I continued to dissect more tissue and the prostatic urethra looks wide open, and the chips were evacuated.  Bleeders were coagulated.  At this point, the resectoscope was removed.  A 22 three- way Foley catheter left in for drainage.  CBI started which was clear. The patient left the operating room in satisfactory condition.     Ky Barban,  M.D.     MIJ/MEDQ  D:  01/29/2013  T:  01/30/2013  Job:  [id number]

## 2013-01-30 NOTE — Progress Notes (Signed)
UR Chart Review Completed  

## 2013-01-30 NOTE — Plan of Care (Signed)
Pt's foley remains pink and few clots have been noted - Pt's irrigation will be stopped for now (per orders).

## 2013-01-30 NOTE — Anesthesia Postprocedure Evaluation (Signed)
  Anesthesia Post-op Note  Patient: Connor [name]  Procedure(s) Performed: Procedure(s): TRANSURETHRAL RESECTION OF THE PROSTATE (TURP) (N/A)  Patient Location: Room 340  Anesthesia Type:Spinal  Level of Consciousness: awake, alert , oriented and patient cooperative  Airway and Oxygen Therapy: Patient Spontanous Breathing  Post-op Pain: moderate  Post-op Assessment: Post-op Vital signs reviewed, Patient's Cardiovascular Status Stable, Respiratory Function Stable, Patent Airway and Pain level controlled  Post-op Vital Signs: Reviewed and stable  Complications: No apparent anesthesia complications Patient with some  Nausea and vomiting yesterday; better today

## 2013-01-30 NOTE — Plan of Care (Signed)
Problem: Phase II Progression Outcomes Goal: Hemodynamically stable Explained to pt/family why we were stopping irrigation for now.

## 2013-01-31 ENCOUNTER — Encounter (HOSPITAL_COMMUNITY): Payer: Self-pay | Admitting: Urology

## 2013-01-31 MED ORDER — CIPROFLOXACIN HCL 250 MG PO TABS: 250.0000 mg | ORAL_TABLET | Freq: Two times a day (BID) | ORAL | Status: AC

## 2013-01-31 MED ORDER — CIPROFLOXACIN HCL 250 MG PO TABS
250.0000 mg | ORAL_TABLET | Freq: Two times a day (BID) | ORAL | Status: DC
  Administered 2013-01-31: 250 mg via ORAL
  Filled 2013-01-31: qty 1

## 2013-01-31 MED ORDER — OXYCODONE-ACETAMINOPHEN 5-325 MG PO TABS
1.0000 | ORAL_TABLET | Freq: Four times a day (QID) | ORAL | Status: DC | PRN
  Administered 2013-01-31: 1 via ORAL
  Filled 2013-01-31: qty 1

## 2013-01-31 NOTE — Plan of Care (Signed)
Problem: Discharge Progression Outcomes Goal: Discharge plan in place and appropriate Talked with pt/family about DC plans

## 2013-01-31 NOTE — Progress Notes (Signed)
NAME:  DUVALL, COMES NO.:  [id number]  MEDICAL RECORD NO.:  [id number]  LOCATION:                                 FACILITY:  PHYSICIAN:  Ky Barban, M.D.DATE OF BIRTH:  01/17/1946  DATE OF PROCEDURE:  01/31/2013 DATE OF DISCHARGE:  01/31/2013                                PROGRESS NOTE   Mr. Sainvil had a TUR prostate done 2 days ago.  He is doing fine. Abdomen is soft.  He is feeling better.  CBI of the urine was very clear.  He is afebrile.  My plan is to discharge him home with the catheter which I will take it out on Monday in the office.  He can resume his medicines and I am going to give him Cipro 250 mg twice a day for another 5 days.  We will see him back in the office on Monday.  His lab workup from yesterday, sodium is 137, potassium 4.6, chloride 102, CO2 is 29, BUN is 12, creatinine 1.06, WBC count is 10.3, hematocrit 46%.  We will see him back in the office on Monday.     Ky Barban, M.D.     MIJ/MEDQ  D:  01/31/2013  T:  01/31/2013  Job:  [id number]

## 2013-01-31 NOTE — Discharge Summary (Signed)
Dictation [id number]

## 2013-01-31 NOTE — Discharge Summary (Signed)
Pt stated he was ready to go and pain was under control.  Pt's bag was switched to leg bag and he was educated as to how to empty and clean foley/bag as needed.  Pt was also asked to changed to standard bag at night to avoid possible backflow up the tubing.  Pt was asked to set up FU was Dr. Kyra Manges on Monday to have cath removed.  Pt's foley was left intact to go home per orders of Dr. Kyra Manges.  Pt was also given script for oral antibiotic and IV was removed prior to DC.  Pt was wheeld to car by PCT and family.

## 2013-02-01 NOTE — Discharge Summary (Signed)
NAME:  Connor [name], Connor [name] NO.:  [id number]  MEDICAL RECORD NO.:  [id number]  LOCATION:  A340                          FACILITY:  [facility]  PHYSICIAN:  Ky Barban, M.D.DATE OF BIRTH:  Nov 21, 1945  DATE OF ADMISSION:  01/29/2013 DATE OF DISCHARGE:  08/28/2014LH                              DISCHARGE SUMMARY   This 67 year old gentleman who is in acute urinary retention and BPH with bladder neck obstruction.  He was brought in as outpatient, undergo TUR prostate which was done here day before yesterday.  First postop day, he had been having some vomiting where his abdomen is soft and flat.  Liver, spleen, kidneys were not palpable.  CBI was clear.  The urine was clear which I discontinued CVA yesterday.  He was started on clear liquid diet, and given Zofran for nausea.  His vomiting has subsided and today patient is doing well.  Having no urinary complaints. No vomiting any more.  No fever.  His sodium yesterday was 137, potassium 4.6, chloride 102, CO2 is 29, BUN is 12, creatinine 1.06.  WBC count is 10.3 yesterday, hematocrit is 46%.  Today, he is doing well. Urine is absolutely clear, so it was decided to discharge him home today.  I am going to send him home with Foley catheter.  I told him if he has any fever or bleeding to let me know.  I am going to give him Cipro 250 mg 1 twice a day for 5 more days.  I will see him back here in the office on  Monday, Monday is a holiday, so I will see him on Tuesday.  FINAL DISCHARGE DIAGNOSIS:  His final pathology report is not back yet. I also want to mention that he has rather very large prostate.  It is possible that I have to go back and do another TUR prostate, hard to do open prostatectomy.  I have discussed this with the patient, he understands.  I will take the catheter out and see if he can void satisfactorily.  He does not have any hematuria then as long is not having any hematuria or voiding satisfactorily,  then I would not do anything.     Ky Barban, M.D.     MIJ/MEDQ  D:  01/31/2013  T:  02/01/2013  Job:  [id number]

## 2013-02-05 DIAGNOSIS — N400 Enlarged prostate without lower urinary tract symptoms: Secondary | ICD-10-CM | POA: Diagnosis not present

## 2013-03-14 DIAGNOSIS — Z23 Encounter for immunization: Secondary | ICD-10-CM | POA: Diagnosis not present

## 2013-05-08 DIAGNOSIS — Z125 Encounter for screening for malignant neoplasm of prostate: Secondary | ICD-10-CM | POA: Diagnosis not present

## 2013-05-08 DIAGNOSIS — E785 Hyperlipidemia, unspecified: Secondary | ICD-10-CM | POA: Diagnosis not present

## 2013-05-08 DIAGNOSIS — I2510 Atherosclerotic heart disease of native coronary artery without angina pectoris: Secondary | ICD-10-CM | POA: Diagnosis not present

## 2013-05-08 DIAGNOSIS — Z79899 Other long term (current) drug therapy: Secondary | ICD-10-CM | POA: Diagnosis not present

## 2013-05-16 DIAGNOSIS — N390 Urinary tract infection, site not specified: Secondary | ICD-10-CM | POA: Diagnosis not present

## 2013-05-16 DIAGNOSIS — I2510 Atherosclerotic heart disease of native coronary artery without angina pectoris: Secondary | ICD-10-CM | POA: Diagnosis not present

## 2013-05-16 DIAGNOSIS — E785 Hyperlipidemia, unspecified: Secondary | ICD-10-CM | POA: Diagnosis not present

## 2013-05-16 DIAGNOSIS — Z23 Encounter for immunization: Secondary | ICD-10-CM | POA: Diagnosis not present

## 2013-08-21 DIAGNOSIS — N400 Enlarged prostate without lower urinary tract symptoms: Secondary | ICD-10-CM | POA: Diagnosis not present

## 2013-11-18 DIAGNOSIS — I10 Essential (primary) hypertension: Secondary | ICD-10-CM | POA: Diagnosis not present

## 2013-11-18 DIAGNOSIS — I2510 Atherosclerotic heart disease of native coronary artery without angina pectoris: Secondary | ICD-10-CM | POA: Diagnosis not present

## 2013-11-20 DIAGNOSIS — N400 Enlarged prostate without lower urinary tract symptoms: Secondary | ICD-10-CM | POA: Diagnosis not present

## 2014-03-21 DIAGNOSIS — Z23 Encounter for immunization: Secondary | ICD-10-CM | POA: Diagnosis not present

## 2014-05-16 DIAGNOSIS — I2510 Atherosclerotic heart disease of native coronary artery without angina pectoris: Secondary | ICD-10-CM | POA: Diagnosis not present

## 2014-05-16 DIAGNOSIS — Z125 Encounter for screening for malignant neoplasm of prostate: Secondary | ICD-10-CM | POA: Diagnosis not present

## 2014-05-16 DIAGNOSIS — E785 Hyperlipidemia, unspecified: Secondary | ICD-10-CM | POA: Diagnosis not present

## 2014-05-16 DIAGNOSIS — Z79899 Other long term (current) drug therapy: Secondary | ICD-10-CM | POA: Diagnosis not present

## 2014-05-23 DIAGNOSIS — I2510 Atherosclerotic heart disease of native coronary artery without angina pectoris: Secondary | ICD-10-CM | POA: Diagnosis not present

## 2014-05-23 DIAGNOSIS — I10 Essential (primary) hypertension: Secondary | ICD-10-CM | POA: Diagnosis not present

## 2014-12-12 DIAGNOSIS — I2510 Atherosclerotic heart disease of native coronary artery without angina pectoris: Secondary | ICD-10-CM | POA: Diagnosis not present

## 2014-12-23 IMAGING — US US RENAL
1 series · 14 of 25 positions shown · non-contrast
Comparison: none

Retroperitoneal ultrasound
HISTORY: Difficulty urinating

[Series 1: us renal · 0.27mm/px · 14 of 32 slices shown]
[im 1/32]
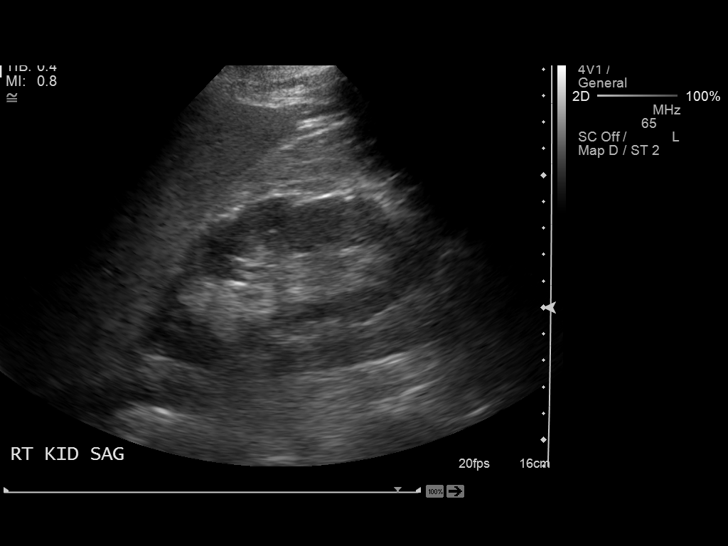
[im 3/32]
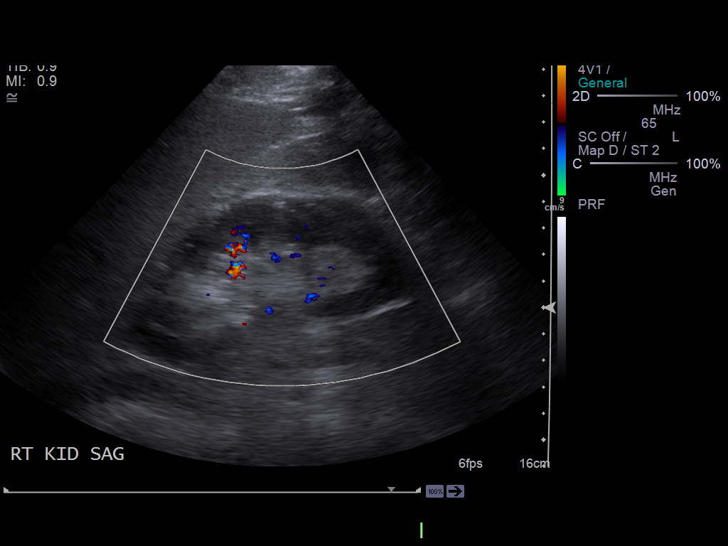
[im 6/32]
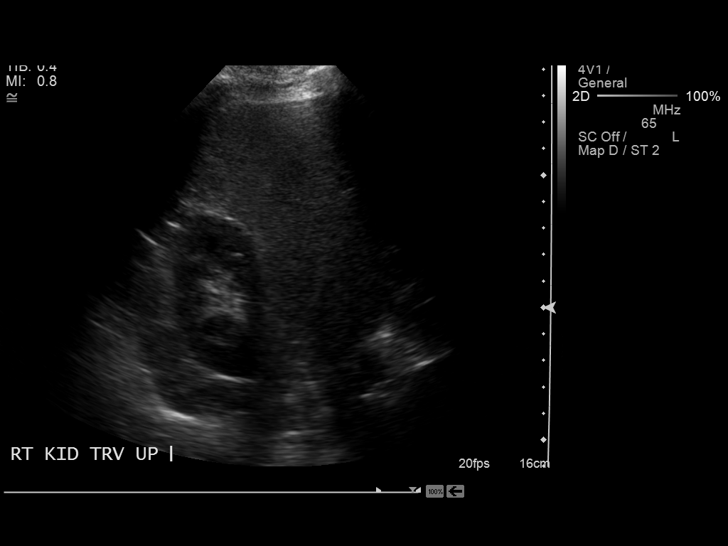
[im 8/32]
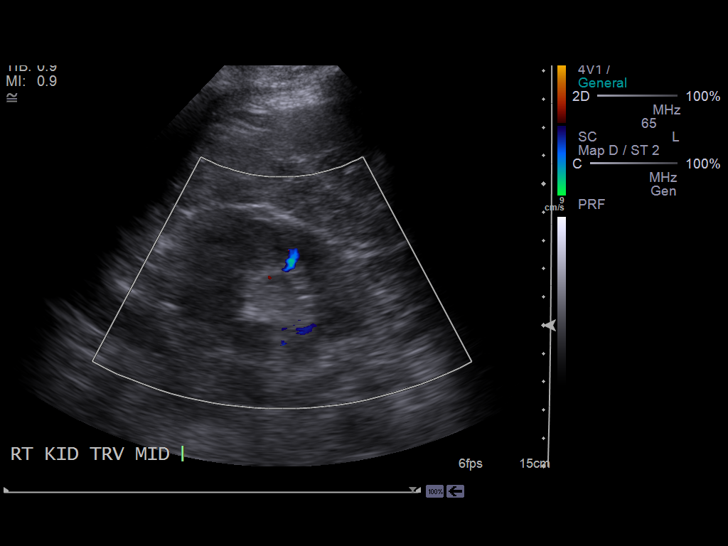
[im 11/32]
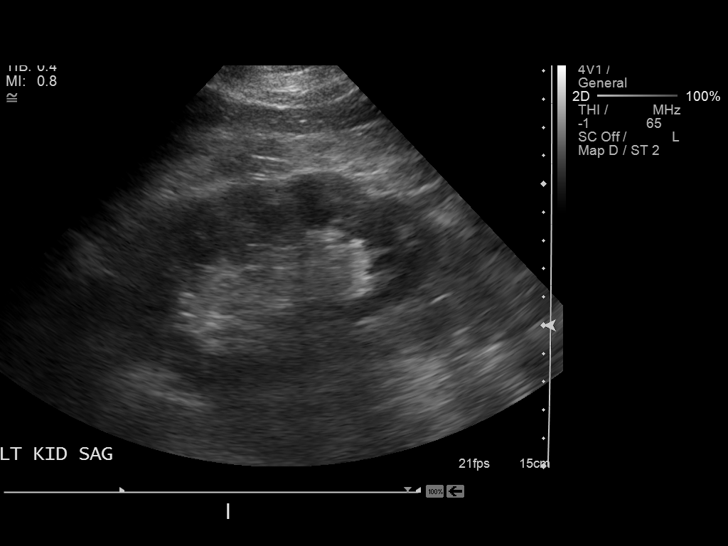
[im 12/32]
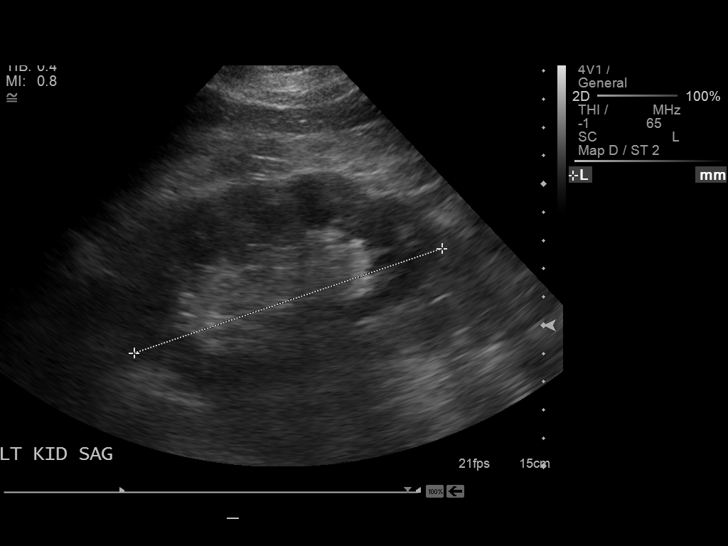
[im 15/32]
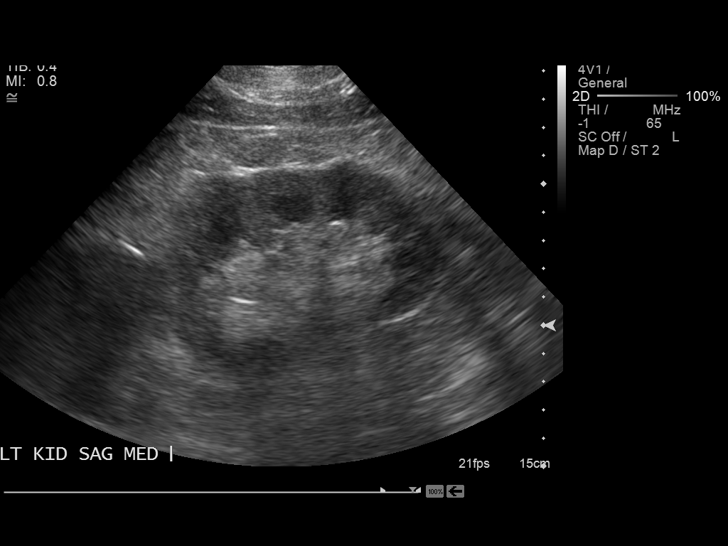
[im 17/32]
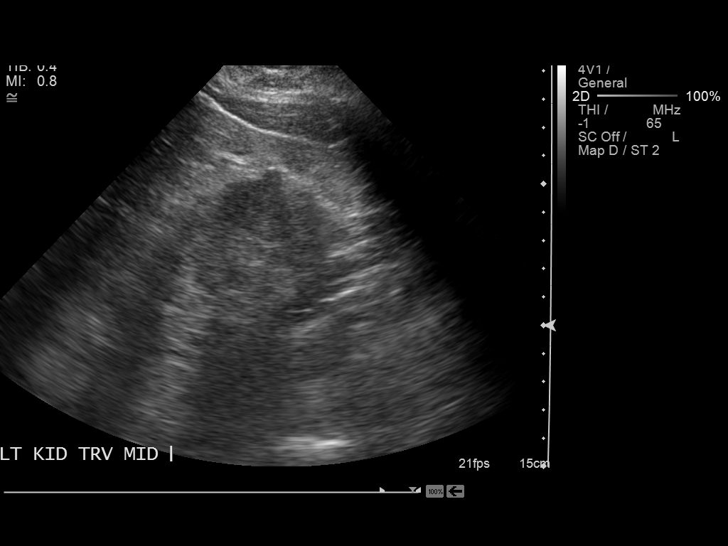
[im 20/32]
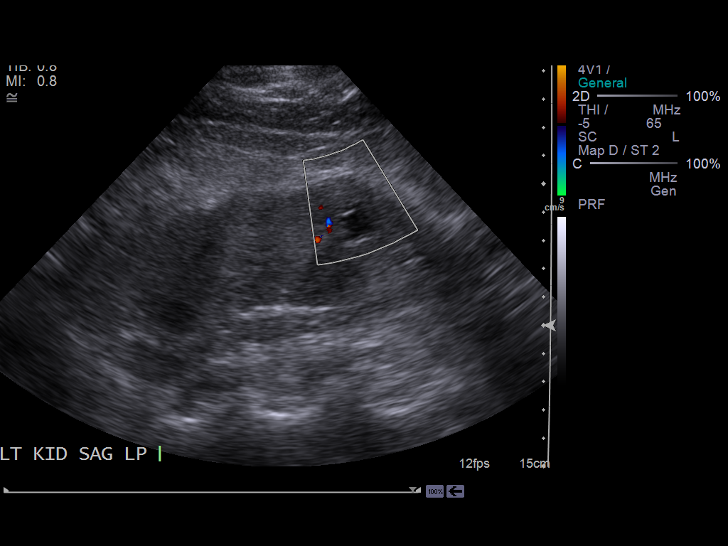
[im 21/32]
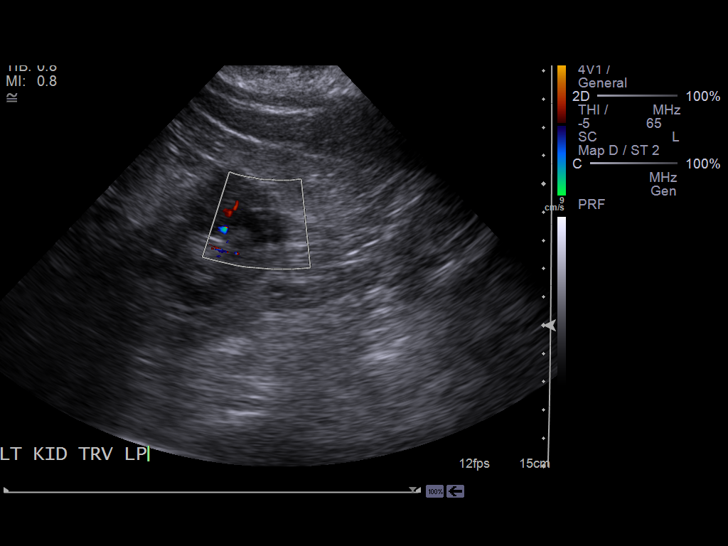
[im 24/32]
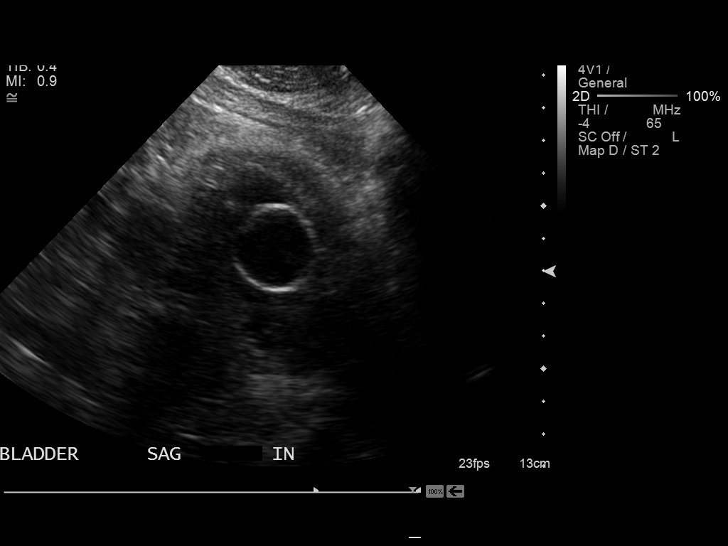
[im 26/32]
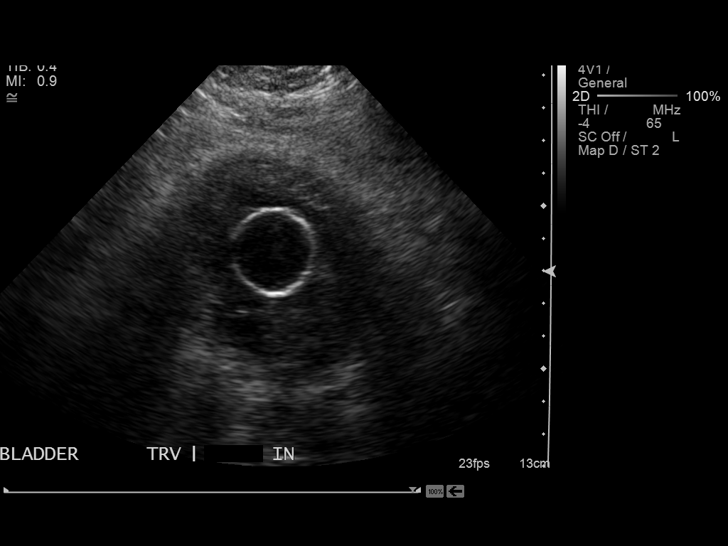
[im 29/32]
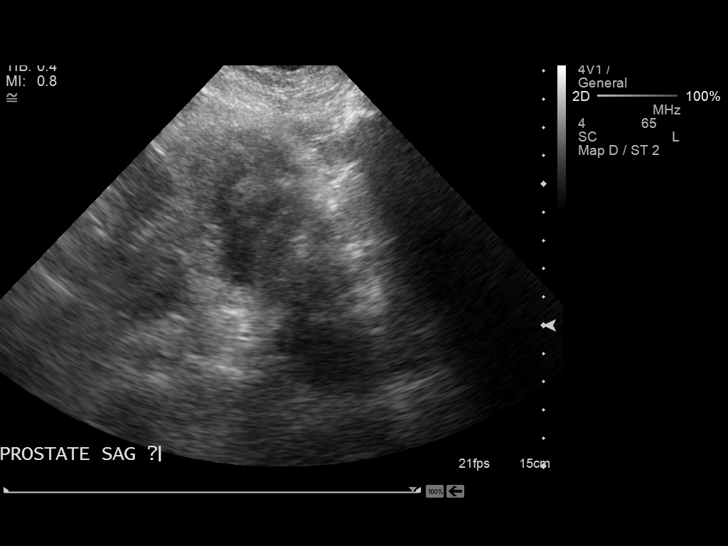
[im 32/32]
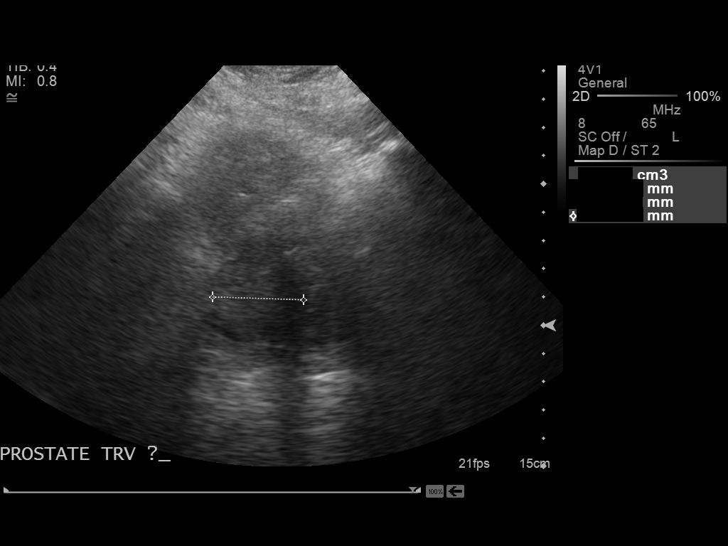

[14 of 25 positions shown; findings below may reference images not displayed]

FINDINGS: Right kidney measures 12.1 cm length.  Left kidney
measures 11.5 cm in length.  There is a moderately septated cystic
mass arising from the lower pole left kidney measuring 1.2 x 1.2 x
1.4 cm.  No other masses are identified in either kidney.  There is
no pelvicaliectasis or perinephric fluid collection on either side.
There is no sonographically demonstrable calculus or ureterectasis
on either side.  Renal cortical thickness and echogenicity are
normal bilaterally.

There is a Foley catheter in the urinary bladder.  Urinary bladder
does appear to have a thickened wall.

Prostate is not well seen.  Prostate measures approximately 4.3 x
3.2 x 3.2 cm in size.
CONCLUSION: Question of thickening of the urinary bladder wall.
Foley catheter in place making evaluation of the urinary bladder
less than optimal.

Small Bosniak IIF cystic lesion arising from lower pole left
kidney.  Given this finding, a followup study in approximately 6
months would be reasonable to further assess.

Study otherwise unremarkable.

## 2015-02-20 DIAGNOSIS — H2512 Age-related nuclear cataract, left eye: Secondary | ICD-10-CM | POA: Diagnosis not present

## 2015-02-20 DIAGNOSIS — H52223 Regular astigmatism, bilateral: Secondary | ICD-10-CM | POA: Diagnosis not present

## 2015-02-20 DIAGNOSIS — H524 Presbyopia: Secondary | ICD-10-CM | POA: Diagnosis not present

## 2015-02-20 DIAGNOSIS — H5203 Hypermetropia, bilateral: Secondary | ICD-10-CM | POA: Diagnosis not present

## 2015-02-25 ENCOUNTER — Telehealth: Payer: Self-pay

## 2015-02-25 NOTE — Telephone Encounter (Signed)
Pt was referred by Dr. Ouida Sills for colonoscopy. His last one was 10/19/2006 by Dr. Jena Gauss and his next was to be in 2011. Letter was mailed in 2011 and also in 12/2014. I have called and could not leave a message.

## 2015-03-05 NOTE — Telephone Encounter (Signed)
Letter to pt and to PCP.  

## 2015-03-17 DIAGNOSIS — Z23 Encounter for immunization: Secondary | ICD-10-CM | POA: Diagnosis not present

## 2015-03-30 ENCOUNTER — Ambulatory Visit (INDEPENDENT_AMBULATORY_CARE_PROVIDER_SITE_OTHER): Payer: Medicare Other | Admitting: Gastroenterology

## 2015-03-30 ENCOUNTER — Other Ambulatory Visit: Payer: Self-pay

## 2015-03-30 ENCOUNTER — Encounter: Payer: Self-pay | Admitting: Gastroenterology

## 2015-03-30 VITALS — BP 160/82 | HR 49 | Temp 97.4°F | Ht 70.0 in | Wt 202.2 lb

## 2015-03-30 DIAGNOSIS — Z86010 Personal history of colonic polyps: Secondary | ICD-10-CM | POA: Diagnosis not present

## 2015-03-30 DIAGNOSIS — Q6100 Congenital renal cyst, unspecified: Secondary | ICD-10-CM | POA: Diagnosis not present

## 2015-03-30 DIAGNOSIS — N281 Cyst of kidney, acquired: Secondary | ICD-10-CM

## 2015-03-30 MED ORDER — PEG 3350-KCL-NA BICARB-NACL 420 G PO SOLR: 4000.0000 mL | ORAL | Status: DC

## 2015-03-30 NOTE — Assessment & Plan Note (Signed)
69 y/o male who presents for surveillance colonoscopy. Occasional flare of hemorrhoids with itching and toilet tissue hematochezia but otherwise doing well.   Colonoscopy in near future. Phenergan 12.5mg  IV to augment Conscious sedation given use of Ativan, and empirically for history of vomiting with sedation.Patient reports possible allergy to Demerol with several days of vomiting and diarrhea after last procedure.

## 2015-03-30 NOTE — Progress Notes (Signed)
cc'ed to pcp °

## 2015-03-30 NOTE — Assessment & Plan Note (Signed)
H/O renal u/s in July 2014 which showed small Bosniak IIF cystic lesion arising from lower pole left kidney.Radiologist recommended 6 month follow u/s. Patient's urologist (who was ordered provider) has retired and no follow up performed.   Recommend patient follow up with PCP, Dr. Ouida SillsFagan. I will send a letter and copy of renal u/s. Patient has upcoming appointment in couple of months. Patient preferred this approach as opposed to me ordering the follow up study myself.

## 2015-03-30 NOTE — Patient Instructions (Addendum)
Colonoscopy as scheduled. See separate instructions.   I will send information to Dr. Ouida SillsFagan about left kidney cyst seen in 2014. You should discuss possibility of follow up ultrasound to reevaluate.

## 2015-03-30 NOTE — Progress Notes (Signed)
Primary Care Physician:  Carylon Perches, MD  Primary Gastroenterologist:  Roetta Sessions, MD   Chief Complaint  Patient presents with  . set up TCS (pt is overdue)    HPI:  Connor [name] is a 69 y.o. male here for surveillance colonoscopy. Last colonoscopy in May 2008. Cecal tubular adenoma removed. Surveillance colonoscopy was due in 2011 but patient did not have this done.  BM 2-3 times per day. Hemorrhoid problems at times with itching/bleeding on toilet tissue. With last colonoscopy had nausea, vomiting, diarrhea for 3 days after his procedure. He thought it might be due to the Demerol. Prefers to avoid Demerol. Takes Zantac if he plans to eat something that causes heartburn. Denies dysphagia, abdominal pain, melena, weight loss.  2 brothers deceased due cancer. One had prostate cancer. He believes the other may have had pancreatic cancer. Denies family history of colon cancer.   Current Outpatient Prescriptions  Medication Sig Dispense Refill  . LORazepam (ATIVAN) 1 MG tablet Take 1 mg by mouth every 8 (eight) hours.    . metoprolol (LOPRESSOR) 100 MG tablet Take 100 mg by mouth daily.    . naproxen sodium (ALEVE) 220 MG tablet Take 220 mg by mouth 2 (two) times daily as needed.    . nitroGLYCERIN (NITROSTAT) 0.4 MG SL tablet Place 0.4 mg under the tongue every 5 (five) minutes as needed for chest pain. Last dose 6 months ago per pt 03/30/2015    . ranitidine (ZANTAC) 150 MG tablet Take 150 mg by mouth daily as needed for heartburn.    . simvastatin (ZOCOR) 40 MG tablet Take 40 mg by mouth at bedtime.    . trandolapril (MAVIK) 2 MG tablet Take 2 mg by mouth daily.     No current facility-administered medications for this visit.    Allergies as of 03/30/2015 - Review Complete 03/30/2015  Allergen Reaction Noted  . Demerol [meperidine] Diarrhea and Nausea And Vomiting 01/16/2013    Past Medical History  Diagnosis Date  . Coronary artery disease   . High cholesterol   .  Hypertension   . Anxiety   . Hypercholesteremia   . GERD (gastroesophageal reflux disease)   . BPH (benign prostatic hyperplasia)     Past Surgical History  Procedure Laterality Date  . Cardiac surgery    . Hernia repair Right   . Cardiac catheterization      stent inserted  . Transurethral resection of prostate N/A 01/29/2013    Procedure: TRANSURETHRAL RESECTION OF THE PROSTATE (TURP);  Surgeon: Ky Barban, MD;  Location: [facility];  Service: Urology;  Laterality: N/A;  . Colonoscopy  10/19/2006    WGN:FAOZHYQMV friable anal canal with normal rectum. tubular adenoma -cecum    Family History  Problem Relation Age of Onset  . Diabetes Mother   . Diabetes Father   . Heart attack Father   . Diabetes Sister   . Diabetes Brother   . Heart attack Brother   . Kidney disease Sister   . Diabetes Brother   . Colon cancer Neg Hx   . Prostate cancer Brother   . Cancer Brother     ?pancreatic    Social History   Social History  . Marital Status: Single    Spouse Name: N/A  . Number of Children: N/A  . Years of Education: N/A   Occupational History  . retired from city of [employer]    Social History Main Topics  . Smoking status: Former Smoker -- 2.00 packs/day  for 40 years    Types: Cigarettes    Quit date: 01/24/1999  . Smokeless tobacco: Not on file  . Alcohol Use: Yes     Comment: occassional beer  . Drug Use: No  . Sexual Activity: Yes    Birth Control/ Protection: None   Other Topics Concern  . Not on file   Social History Narrative      ROS:  General: Negative for anorexia, weight loss, fever, chills, fatigue, weakness. Eyes: Negative for vision changes.  ENT: Negative for hoarseness, difficulty swallowing , nasal congestion. CV: Negative for chest pain, angina, palpitations, dyspnea on exertion, peripheral edema.  Respiratory: Negative for dyspnea at rest, dyspnea on exertion, cough, sputum, wheezing.  GI: See history of present illness. GU:   Negative for dysuria, hematuria, urinary incontinence, urinary frequency, nocturnal urination.  MS: Negative for joint pain, low back pain.  Derm: Negative for rash or itching.  Neuro: Negative for weakness, abnormal sensation, seizure, frequent headaches, memory loss, confusion.  Psych: Negative for anxiety, depression, suicidal ideation, hallucinations.  Endo: Negative for unusual weight change.  Heme: Negative for bruising or bleeding. Allergy: Negative for rash or hives.    Physical Examination:  BP 160/82 mmHg  Pulse 49  Temp(Src) 97.4 F (36.3 C)  Ht 5\' 10"  (1.778 m)  Wt 202 lb 3.2 oz (91.717 kg)  BMI 29.01 kg/m2   General: Well-nourished, well-developed in no acute distress.  Head: Normocephalic, atraumatic.   Eyes: Conjunctiva pink, no icterus. Mouth: Oropharyngeal mucosa moist and pink , no lesions erythema or exudate. Neck: Supple without thyromegaly, masses, or lymphadenopathy.  Lungs: Clear to auscultation bilaterally.  Heart: Regular rate and rhythm, no murmurs rubs or gallops.  Abdomen: Bowel sounds are normal, nontender, nondistended, no hepatosplenomegaly or masses, no abdominal bruits or    hernia , no rebound or guarding.   Rectal: deferred Extremities: No lower extremity edema. No clubbing or deformities.  Neuro: Alert and oriented x 4 , grossly normal neurologically.  Skin: Warm and dry, no rash or jaundice.   Psych: Alert and cooperative, normal mood and affect.     Imaging Studies: No results found.

## 2015-04-03 ENCOUNTER — Encounter (HOSPITAL_COMMUNITY): Payer: Self-pay

## 2015-04-03 ENCOUNTER — Encounter (HOSPITAL_COMMUNITY)
Admission: RE | Disposition: A | Discharge status: Home / Self Care | Payer: Self-pay | Source: Ambulatory Visit | Attending: Internal Medicine

## 2015-04-03 ENCOUNTER — Ambulatory Visit (HOSPITAL_COMMUNITY)
Admission: RE | Admit: 2015-04-03 | Discharge: 2015-04-03 | Disposition: A | Discharge status: Home / Self Care | Payer: Medicare Other | Source: Ambulatory Visit | Attending: Internal Medicine | Admitting: Internal Medicine

## 2015-04-03 DIAGNOSIS — E7800 Pure hypercholesterolemia, unspecified: Secondary | ICD-10-CM | POA: Diagnosis not present

## 2015-04-03 DIAGNOSIS — Z7982 Long term (current) use of aspirin: Secondary | ICD-10-CM | POA: Insufficient documentation

## 2015-04-03 DIAGNOSIS — Z860100 Personal history of colon polyps, unspecified: Secondary | ICD-10-CM | POA: Insufficient documentation

## 2015-04-03 DIAGNOSIS — Z79899 Other long term (current) drug therapy: Secondary | ICD-10-CM | POA: Insufficient documentation

## 2015-04-03 DIAGNOSIS — K219 Gastro-esophageal reflux disease without esophagitis: Secondary | ICD-10-CM | POA: Insufficient documentation

## 2015-04-03 DIAGNOSIS — Z01810 Encounter for preprocedural cardiovascular examination: Secondary | ICD-10-CM | POA: Insufficient documentation

## 2015-04-03 DIAGNOSIS — Z8042 Family history of malignant neoplasm of prostate: Secondary | ICD-10-CM | POA: Diagnosis not present

## 2015-04-03 DIAGNOSIS — Z87891 Personal history of nicotine dependence: Secondary | ICD-10-CM | POA: Diagnosis not present

## 2015-04-03 DIAGNOSIS — I10 Essential (primary) hypertension: Secondary | ICD-10-CM | POA: Insufficient documentation

## 2015-04-03 DIAGNOSIS — F419 Anxiety disorder, unspecified: Secondary | ICD-10-CM | POA: Diagnosis not present

## 2015-04-03 DIAGNOSIS — Z808 Family history of malignant neoplasm of other organs or systems: Secondary | ICD-10-CM | POA: Diagnosis not present

## 2015-04-03 DIAGNOSIS — I2510 Atherosclerotic heart disease of native coronary artery without angina pectoris: Secondary | ICD-10-CM | POA: Insufficient documentation

## 2015-04-03 DIAGNOSIS — Z1211 Encounter for screening for malignant neoplasm of colon: Secondary | ICD-10-CM | POA: Diagnosis not present

## 2015-04-03 HISTORY — PX: COLONOSCOPY: SHX5424

## 2015-04-03 SURGERY — COLONOSCOPY
Anesthesia: Moderate Sedation

## 2015-04-03 MED ORDER — STERILE WATER FOR IRRIGATION IR SOLN
Status: DC | PRN
  Administered 2015-04-03: 13:00:00

## 2015-04-03 MED ORDER — FENTANYL CITRATE (PF) 100 MCG/2ML IJ SOLN
INTRAMUSCULAR | Status: AC
  Filled 2015-04-03: qty 4

## 2015-04-03 MED ORDER — MIDAZOLAM HCL 5 MG/5ML IJ SOLN
INTRAMUSCULAR | Status: DC | PRN
  Administered 2015-04-03: 2 mg via INTRAVENOUS

## 2015-04-03 MED ORDER — MIDAZOLAM HCL 5 MG/5ML IJ SOLN
INTRAMUSCULAR | Status: AC
  Filled 2015-04-03: qty 10

## 2015-04-03 MED ORDER — ONDANSETRON HCL 4 MG/2ML IJ SOLN
INTRAMUSCULAR | Status: DC | PRN
  Administered 2015-04-03: 4 mg via INTRAVENOUS

## 2015-04-03 MED ORDER — SODIUM CHLORIDE 0.9 % IJ SOLN
INTRAMUSCULAR | Status: AC
  Filled 2015-04-03: qty 3

## 2015-04-03 MED ORDER — FENTANYL CITRATE (PF) 100 MCG/2ML IJ SOLN
INTRAMUSCULAR | Status: DC | PRN
  Administered 2015-04-03: 50 ug via INTRAVENOUS

## 2015-04-03 MED ORDER — PROMETHAZINE HCL 25 MG/ML IJ SOLN
12.5000 mg | Freq: Once | INTRAMUSCULAR | Status: AC
  Administered 2015-04-03: 12.5 mg via INTRAVENOUS

## 2015-04-03 MED ORDER — ONDANSETRON HCL 4 MG/2ML IJ SOLN
INTRAMUSCULAR | Status: AC
  Filled 2015-04-03: qty 2

## 2015-04-03 MED ORDER — PROMETHAZINE HCL 25 MG/ML IJ SOLN
INTRAMUSCULAR | Status: AC
  Filled 2015-04-03: qty 1

## 2015-04-03 MED ORDER — SODIUM CHLORIDE 0.9 % IV SOLN
INTRAVENOUS | Status: DC
  Administered 2015-04-03: 12:00:00 via INTRAVENOUS

## 2015-04-03 NOTE — Discharge Instructions (Signed)
Colonoscopy Discharge Instructions  Read the instructions outlined below and refer to this sheet in the next few weeks. These discharge instructions provide you with general information on caring for yourself after you leave the hospital. Your doctor may also give you specific instructions. While your treatment has been planned according to the most current medical practices available, unavoidable complications occasionally occur. If you have any problems or questions after discharge, call Dr. Jena Gauss at [phone]. ACTIVITY  You may resume your regular activity, but move at a slower pace for the next 24 hours.   Take frequent rest periods for the next 24 hours.   Walking will help get rid of the air and reduce the bloated feeling in your belly (abdomen).   No driving for 24 hours (because of the medicine (anesthesia) used during the test).    Do not sign any important legal documents or operate any machinery for 24 hours (because of the anesthesia used during the test).  NUTRITION  Drink plenty of fluids.   You may resume your normal diet as instructed by your doctor.   Begin with a light meal and progress to your normal diet. Heavy or fried foods are harder to digest and may make you feel sick to your stomach (nauseated).   Avoid alcoholic beverages for 24 hours or as instructed.  MEDICATIONS  You may resume your normal medications unless your doctor tells you otherwise.  WHAT YOU CAN EXPECT TODAY  Some feelings of bloating in the abdomen.   Passage of more gas than usual.   Spotting of blood in your stool or on the toilet paper.  IF YOU HAD POLYPS REMOVED DURING THE COLONOSCOPY:  No aspirin products for 7 days or as instructed.   No alcohol for 7 days or as instructed.   Eat a soft diet for the next 24 hours.  FINDING OUT THE RESULTS OF YOUR TEST Not all test results are available during your visit. If your test results are not back during the visit, make an appointment  with your caregiver to find out the results. Do not assume everything is normal if you have not heard from your caregiver or the medical facility. It is important for you to follow up on all of your test results.  SEEK IMMEDIATE MEDICAL ATTENTION IF:  You have more than a spotting of blood in your stool.   Your belly is swollen (abdominal distention).   You are nauseated or vomiting.   You have a temperature over 101.   You have abdominal pain or discomfort that is severe or gets worse throughout the day.     Recommend 1 more colonoscopy for surveillance purposes in 7 years only if overall health permits.  First-Degree Atrioventricular Block First-degree atrioventricular (AV) block is a type of heart block. About Heart Block Heart block is a problem with the system that controls how often the heart beats (heart rate). If you have heart block, the electrical signals that regulate your heart rate are slowed or interrupted. Normal Heart Action The heart has two upper chambers (atria) and two lower chambers (ventricles). They work together to pump blood to the body. The heartbeat starts in an upper area of the right atrium (sinoatrial node, or SA node). This is the heart's natural pacemaker. The SA node sends electrical signals that pass through another node (atrioventricular node, or AV node), which is located between the atria and ventricles. Next, the signals travel through the ventricles on conduction pathways. As the signals  pass down these pathways, the ventricles contract and send blood out to the body. About First-Degree AV Block First-degree heart block is the least serious type of AV block. If you have first-degree AV block, the signals travel more slowly through your heart. This can cause your heart to beat more slowly than normal, but your heart does not miss any beats. Treatment is usually not needed, but the condition may increase your risk of developing an irregular heart rhythm  (atrial fibrillation). CAUSES  In some cases, a person is born with heart block. More often, the condition develops over time. First-degree heart block may be caused by:  Any condition that damages the heart's conducting system.  Overstimulation of a nerve that slows down the heart (vagus nerve). This is common in well-conditioned athletes.  Some medicines that slow down the heart rate. RISK FACTORS The risk for this condition increases with age. It is also more likely to develop in people who have any of the following conditions:  A heart attack in the past.  Heart failure.  Coronary heart disease.  Inflammation of heart muscle (myocarditis).  Disease of heart muscle (cardiomyopathy).  Infection of the heart valves (endocarditis).  Infections or diseases that affect the heart. These include:  Lyme disease.  Sarcoidosis.  Hemochromatosis.  Rheumatic fever. SYMPTOMS This condition usually does not cause any symptoms. DIAGNOSIS This condition may be diagnosed during a routine physical exam. Your health care provider may suspect a heart block if you have a slow pulse or heartbeat. You may have tests to confirm the diagnosis and to rule out other conditions. These tests may include:  An electrocardiogram (ECG) to check for problems with the electrical activity in your heart.  Wearing a portable ECG device for a few days (Holter monitor) or a few weeks (event monitor). This is done to monitor your heart rate over time. TREATMENT Usually, treatment is not needed for this condition. You may get treatment for another condition that is causing heart block. You may also need to change or stop taking any heart medicines that could cause heart block. HOME CARE INSTRUCTIONS  Take over-the-counter and prescription medicines only as told by your health care provider.  Work with your health care providers to control all of your risks for heart disease. This may include following these  instructions:  Get regular exercise. Ask your health care provider what type of exercise is safe for you.  Eat a heart-healthy diet. Your health care provider or dietitian can help you make healthy choices.  Maintain a healthy weight.  Do not use any tobacco products, including cigarettes, chewing tobacco, or e-cigarettes. If you need help quitting, ask your health care provider.  Limit alcohol intake to no more than 1 drink per day for nonpregnant women and 2 drinks per day for men. One drink equals 12 oz of beer, 5 oz of wine, or 1 oz of hard liquor.  Keep all follow-up visits as told by your health care provider. This is important. SEEK MEDICAL CARE IF:  You feel as though your heart is skipping beats.  You feel more tired than normal. SEEK IMMEDIATE MEDICAL CARE IF:  You have chest pain, especially if the pain:  Feels like crushing or pressure.  Spreads to your arms, back, neck, or jaw.  You feel short of breath.  You feel light-headed or weak.  You faint.   This information is not intended to replace advice given to you by your health care provider. Make  sure you discuss any questions you have with your health care provider.   Document Released: 05/05/2008 Document Revised: 10/07/2014 Document Reviewed: 04/23/2014 Elsevier Interactive Patient Education [employer]2016 [employer]vier Inc.

## 2015-04-03 NOTE — Interval H&P Note (Signed)
History and Physical Interval Note:  04/03/2015 12:56 PM  Connor [name]  has presented today for surgery, with the diagnosis of HISTORY OF POLYPS  The various methods of treatment have been discussed with the patient and family. After consideration of risks, benefits and other options for treatment, the patient has consented to  Procedure(s) with comments: COLONOSCOPY (N/A) - 1245 as a surgical intervention .  The patient's history has been reviewed, patient examined, no change in status, stable for surgery.  I have reviewed the patient's chart and labs.  Questions were answered to the patient's satisfaction.     [name]  No change. Surveillance colonoscopy per plan.  The risks, benefits, limitations, alternatives and imponderables have been reviewed with the patient. Questions have been answered. All parties are agreeable.

## 2015-04-03 NOTE — Op Note (Signed)
[facility] [address] [place], [id number]   COLONOSCOPY PROCEDURE REPORT  PATIENT: Connor [name], Connor [name]  MR#: [id number] BIRTHDATE: 02/27/1946 , 69  yrs. old GENDER: male ENDOSCOPIST: R.  Roetta SessionsMichael Rourk, MD FACP [facility] REFERRED BY:Roy Ouida SillsFagan, M.D. PROCEDURE DATE:  04/03/2015 PROCEDURE:   Colonoscopy, diagnostic INDICATIONS:surveillance examinations; history of colonic adenoma.  MEDICATIONS: Versed 2 mg IV and fentanyl 50 g IV.  Phenergan 12.5 mg IV.  Zofran 4 mg IV. ASA CLASS:       Class II  CONSENT: The risks, benefits, alternatives and imponderables including but not limited to bleeding, perforation as well as the possibility of a missed lesion have been reviewed.  The potential for biopsy, lesion removal, etc. have also been discussed. Questions have been answered.  All parties agreeable.  Please see the history and physical in the medical record for more information.  DESCRIPTION OF PROCEDURE:   After the risks benefits and alternatives of the procedure were thoroughly explained, informed consent was obtained.  The digital rectal exam      The EC-[id number] ([id number]  endoscope was introduced through the anus and advanced to the cecum, which was identified by both the appendix and ileocecal valve. No adverse events experienced.   The quality of the prep was adequate  The instrument was then slowly withdrawn as the colon was fully examined. Estimated blood loss is zero unless otherwise noted in this procedure report.      COLON FINDINGS: Normal-appearing rectal mucosa.  Normal-appearing colonic mucosa.  Retroflexion was performed. .  Withdrawal time=8 minutes 0 seconds.  The scope was withdrawn and the procedure completed. COMPLICATIONS:  ENDOSCOPIC IMPRESSION: Normal colonoscopy  RECOMMENDATIONS: Consider more surveillance colonoscopy in 7 years only if overall health permits.  eSigned:  R. Roetta SessionsMichael Rourk, MD Jerrel IvoryFACP [facility] 04/03/2015 1:23  PM   cc:  CPT CODES: ICD CODES:  The ICD and CPT codes recommended by this software are interpretations from the data that the clinical staff has captured with the software.  The verification of the translation of this report to the ICD and CPT codes and modifiers is the sole responsibility of the health care institution and practicing physician where this report was generated.  [employer] will not be held responsible for the validity of the ICD and CPT codes included on this report.  AMA assumes no liability for data contained or not contained herein. CPT is a Publishing rights managerregistered trademark of the [employer].

## 2015-04-03 NOTE — H&P (View-Only) (Signed)
Primary Care Physician:  FAGAN,ROY, MD  Primary Gastroenterologist:  Michael Rourk, MD   Chief Complaint  Patient presents with  . set up TCS (pt is overdue)    HPI:  Connor [name] is a 69 y.o. male here for surveillance colonoscopy. Last colonoscopy in May 2008. Cecal tubular adenoma removed. Surveillance colonoscopy was due in 2011 but patient did not have this done.  BM 2-3 times per day. Hemorrhoid problems at times with itching/bleeding on toilet tissue. With last colonoscopy had nausea, vomiting, diarrhea for 3 days after his procedure. He thought it might be due to the Demerol. Prefers to avoid Demerol. Takes Zantac if he plans to eat something that causes heartburn. Denies dysphagia, abdominal pain, melena, weight loss.  2 brothers deceased due cancer. One had prostate cancer. He believes the other may have had pancreatic cancer. Denies family history of colon cancer.   Current Outpatient Prescriptions  Medication Sig Dispense Refill  . LORazepam (ATIVAN) 1 MG tablet Take 1 mg by mouth every 8 (eight) hours.    . metoprolol (LOPRESSOR) 100 MG tablet Take 100 mg by mouth daily.    . naproxen sodium (ALEVE) 220 MG tablet Take 220 mg by mouth 2 (two) times daily as needed.    . nitroGLYCERIN (NITROSTAT) 0.4 MG SL tablet Place 0.4 mg under the tongue every 5 (five) minutes as needed for chest pain. Last dose 6 months ago per pt 03/30/2015    . ranitidine (ZANTAC) 150 MG tablet Take 150 mg by mouth daily as needed for heartburn.    . simvastatin (ZOCOR) 40 MG tablet Take 40 mg by mouth at bedtime.    . trandolapril (MAVIK) 2 MG tablet Take 2 mg by mouth daily.     No current facility-administered medications for this visit.    Allergies as of 03/30/2015 - Review Complete 03/30/2015  Allergen Reaction Noted  . Demerol [meperidine] Diarrhea and Nausea And Vomiting 01/16/2013    Past Medical History  Diagnosis Date  . Coronary artery disease   . High cholesterol   .  Hypertension   . Anxiety   . Hypercholesteremia   . GERD (gastroesophageal reflux disease)   . BPH (benign prostatic hyperplasia)     Past Surgical History  Procedure Laterality Date  . Cardiac surgery    . Hernia repair Right   . Cardiac catheterization      stent inserted  . Transurethral resection of prostate N/A 01/29/2013    Procedure: TRANSURETHRAL RESECTION OF THE PROSTATE (TURP);  Surgeon: Mohammad I Javaid, MD;  Location: [facility];  Service: Urology;  Laterality: N/A;  . Colonoscopy  10/19/2006    RMR:minimally friable anal canal with normal rectum. tubular adenoma -cecum    Family History  Problem Relation Age of Onset  . Diabetes Mother   . Diabetes Father   . Heart attack Father   . Diabetes Sister   . Diabetes Brother   . Heart attack Brother   . Kidney disease Sister   . Diabetes Brother   . Colon cancer Neg Hx   . Prostate cancer Brother   . Cancer Brother     ?pancreatic    Social History   Social History  . Marital Status: Single    Spouse Name: N/A  . Number of Children: N/A  . Years of Education: N/A   Occupational History  . retired from city of [place]    Social History Main Topics  . Smoking status: Former Smoker -- 2.00 packs/day   for 40 years    Types: Cigarettes    Quit date: 01/24/1999  . Smokeless tobacco: Not on file  . Alcohol Use: Yes     Comment: occassional beer  . Drug Use: No  . Sexual Activity: Yes    Birth Control/ Protection: None   Other Topics Concern  . Not on file   Social History Narrative      ROS:  General: Negative for anorexia, weight loss, fever, chills, fatigue, weakness. Eyes: Negative for vision changes.  ENT: Negative for hoarseness, difficulty swallowing , nasal congestion. CV: Negative for chest pain, angina, palpitations, dyspnea on exertion, peripheral edema.  Respiratory: Negative for dyspnea at rest, dyspnea on exertion, cough, sputum, wheezing.  GI: See history of present illness. GU:   Negative for dysuria, hematuria, urinary incontinence, urinary frequency, nocturnal urination.  MS: Negative for joint pain, low back pain.  Derm: Negative for rash or itching.  Neuro: Negative for weakness, abnormal sensation, seizure, frequent headaches, memory loss, confusion.  Psych: Negative for anxiety, depression, suicidal ideation, hallucinations.  Endo: Negative for unusual weight change.  Heme: Negative for bruising or bleeding. Allergy: Negative for rash or hives.    Physical Examination:  BP 160/82 mmHg  Pulse 49  Temp(Src) 97.4 F (36.3 C)  Ht 5\' 10"  (1.778 m)  Wt 202 lb 3.2 oz (91.717 kg)  BMI 29.01 kg/m2   General: Well-nourished, well-developed in no acute distress.  Head: Normocephalic, atraumatic.   Eyes: Conjunctiva pink, no icterus. Mouth: Oropharyngeal mucosa moist and pink , no lesions erythema or exudate. Neck: Supple without thyromegaly, masses, or lymphadenopathy.  Lungs: Clear to auscultation bilaterally.  Heart: Regular rate and rhythm, no murmurs rubs or gallops.  Abdomen: Bowel sounds are normal, nontender, nondistended, no hepatosplenomegaly or masses, no abdominal bruits or    hernia , no rebound or guarding.   Rectal: deferred Extremities: No lower extremity edema. No clubbing or deformities.  Neuro: Alert and oriented x 4 , grossly normal neurologically.  Skin: Warm and dry, no rash or jaundice.   Psych: Alert and cooperative, normal mood and affect.     Imaging Studies: No results found.

## 2015-04-08 ENCOUNTER — Encounter (HOSPITAL_COMMUNITY): Payer: Self-pay | Admitting: Internal Medicine

## 2015-05-25 ENCOUNTER — Other Ambulatory Visit (HOSPITAL_COMMUNITY): Payer: Self-pay | Admitting: Internal Medicine

## 2015-05-25 ENCOUNTER — Ambulatory Visit (HOSPITAL_COMMUNITY)
Admission: RE | Admit: 2015-05-25 | Discharge: 2015-05-25 | Disposition: A | Discharge status: Home / Self Care | Payer: Medicare Other | Source: Ambulatory Visit | Attending: Internal Medicine | Admitting: Internal Medicine

## 2015-05-25 DIAGNOSIS — M25512 Pain in left shoulder: Secondary | ICD-10-CM

## 2015-05-25 DIAGNOSIS — S4992XA Unspecified injury of left shoulder and upper arm, initial encounter: Secondary | ICD-10-CM | POA: Diagnosis not present

## 2015-06-02 DIAGNOSIS — E785 Hyperlipidemia, unspecified: Secondary | ICD-10-CM | POA: Diagnosis not present

## 2015-06-02 DIAGNOSIS — I2510 Atherosclerotic heart disease of native coronary artery without angina pectoris: Secondary | ICD-10-CM | POA: Diagnosis not present

## 2015-06-02 DIAGNOSIS — Z79899 Other long term (current) drug therapy: Secondary | ICD-10-CM | POA: Diagnosis not present

## 2015-06-09 ENCOUNTER — Other Ambulatory Visit (HOSPITAL_COMMUNITY): Payer: Self-pay | Admitting: Internal Medicine

## 2015-06-09 DIAGNOSIS — N281 Cyst of kidney, acquired: Secondary | ICD-10-CM

## 2015-06-09 DIAGNOSIS — Z23 Encounter for immunization: Secondary | ICD-10-CM | POA: Diagnosis not present

## 2015-06-09 DIAGNOSIS — E785 Hyperlipidemia, unspecified: Secondary | ICD-10-CM | POA: Diagnosis not present

## 2015-06-09 DIAGNOSIS — I2510 Atherosclerotic heart disease of native coronary artery without angina pectoris: Secondary | ICD-10-CM | POA: Diagnosis not present

## 2015-06-09 DIAGNOSIS — Z6830 Body mass index (BMI) 30.0-30.9, adult: Secondary | ICD-10-CM | POA: Diagnosis not present

## 2015-06-17 ENCOUNTER — Ambulatory Visit (HOSPITAL_COMMUNITY)
Admission: RE | Admit: 2015-06-17 | Discharge: 2015-06-17 | Disposition: A | Discharge status: Home / Self Care | Payer: Medicare Other | Source: Ambulatory Visit | Attending: Internal Medicine | Admitting: Internal Medicine

## 2015-06-17 DIAGNOSIS — Q6100 Congenital renal cyst, unspecified: Secondary | ICD-10-CM | POA: Diagnosis not present

## 2015-06-17 DIAGNOSIS — N281 Cyst of kidney, acquired: Secondary | ICD-10-CM

## 2015-06-17 DIAGNOSIS — N1330 Unspecified hydronephrosis: Secondary | ICD-10-CM | POA: Diagnosis not present

## 2015-06-30 ENCOUNTER — Ambulatory Visit (INDEPENDENT_AMBULATORY_CARE_PROVIDER_SITE_OTHER): Payer: Medicare Other | Admitting: Urology

## 2015-06-30 ENCOUNTER — Other Ambulatory Visit: Payer: Self-pay | Admitting: Urology

## 2015-06-30 DIAGNOSIS — N1330 Unspecified hydronephrosis: Secondary | ICD-10-CM

## 2015-06-30 DIAGNOSIS — N401 Enlarged prostate with lower urinary tract symptoms: Secondary | ICD-10-CM | POA: Diagnosis not present

## 2015-06-30 DIAGNOSIS — N3289 Other specified disorders of bladder: Secondary | ICD-10-CM | POA: Diagnosis not present

## 2015-06-30 DIAGNOSIS — N329 Bladder disorder, unspecified: Secondary | ICD-10-CM | POA: Diagnosis not present

## 2015-07-10 ENCOUNTER — Ambulatory Visit (HOSPITAL_COMMUNITY)
Admission: RE | Admit: 2015-07-10 | Discharge: 2015-07-10 | Disposition: A | Discharge status: Home / Self Care | Payer: Medicare Other | Source: Ambulatory Visit | Attending: Urology | Admitting: Urology

## 2015-07-10 DIAGNOSIS — N1330 Unspecified hydronephrosis: Secondary | ICD-10-CM

## 2015-07-10 DIAGNOSIS — N281 Cyst of kidney, acquired: Secondary | ICD-10-CM | POA: Insufficient documentation

## 2015-07-10 DIAGNOSIS — I2510 Atherosclerotic heart disease of native coronary artery without angina pectoris: Secondary | ICD-10-CM | POA: Insufficient documentation

## 2015-07-10 DIAGNOSIS — Z9889 Other specified postprocedural states: Secondary | ICD-10-CM | POA: Diagnosis not present

## 2015-07-10 MED ORDER — IOHEXOL 300 MG/ML  SOLN
125.0000 mL | Freq: Once | INTRAMUSCULAR | Status: AC | PRN
  Administered 2015-07-10: 125 mL via INTRAVENOUS

## 2015-12-11 DIAGNOSIS — R55 Syncope and collapse: Secondary | ICD-10-CM | POA: Diagnosis not present

## 2015-12-11 DIAGNOSIS — I700 Atherosclerosis of aorta: Secondary | ICD-10-CM | POA: Diagnosis not present

## 2016-03-14 DIAGNOSIS — Z23 Encounter for immunization: Secondary | ICD-10-CM | POA: Diagnosis not present

## 2016-06-07 DIAGNOSIS — N400 Enlarged prostate without lower urinary tract symptoms: Secondary | ICD-10-CM | POA: Diagnosis not present

## 2016-06-07 DIAGNOSIS — Z79899 Other long term (current) drug therapy: Secondary | ICD-10-CM | POA: Diagnosis not present

## 2016-06-07 DIAGNOSIS — E785 Hyperlipidemia, unspecified: Secondary | ICD-10-CM | POA: Diagnosis not present

## 2016-06-07 DIAGNOSIS — I2510 Atherosclerotic heart disease of native coronary artery without angina pectoris: Secondary | ICD-10-CM | POA: Diagnosis not present

## 2016-06-13 DIAGNOSIS — Z23 Encounter for immunization: Secondary | ICD-10-CM | POA: Diagnosis not present

## 2016-06-13 DIAGNOSIS — E785 Hyperlipidemia, unspecified: Secondary | ICD-10-CM | POA: Diagnosis not present

## 2016-06-13 DIAGNOSIS — Z6829 Body mass index (BMI) 29.0-29.9, adult: Secondary | ICD-10-CM | POA: Diagnosis not present

## 2016-06-13 DIAGNOSIS — I2510 Atherosclerotic heart disease of native coronary artery without angina pectoris: Secondary | ICD-10-CM | POA: Diagnosis not present

## 2016-12-12 DIAGNOSIS — I2510 Atherosclerotic heart disease of native coronary artery without angina pectoris: Secondary | ICD-10-CM | POA: Diagnosis not present

## 2017-03-19 IMAGING — US US RENAL
1 series · 14 of 25 positions shown · non-contrast
Comparison: Renal ultrasound 01/03/2013

CLINICAL DATA: Left renal cyst followup

EXAM:
RENAL / URINARY TRACT ULTRASOUND COMPLETE

[Series 1: us renal · 0.25mm/px · 14 of 45 slices shown]
[im 1/45]
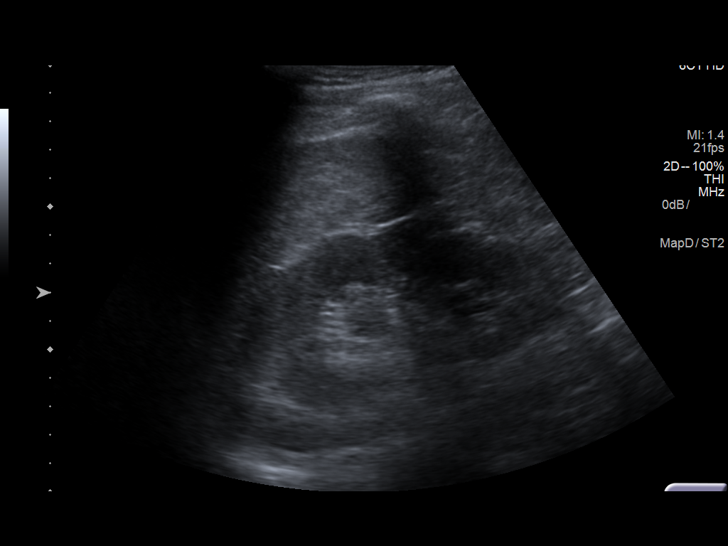
[im 4/45]
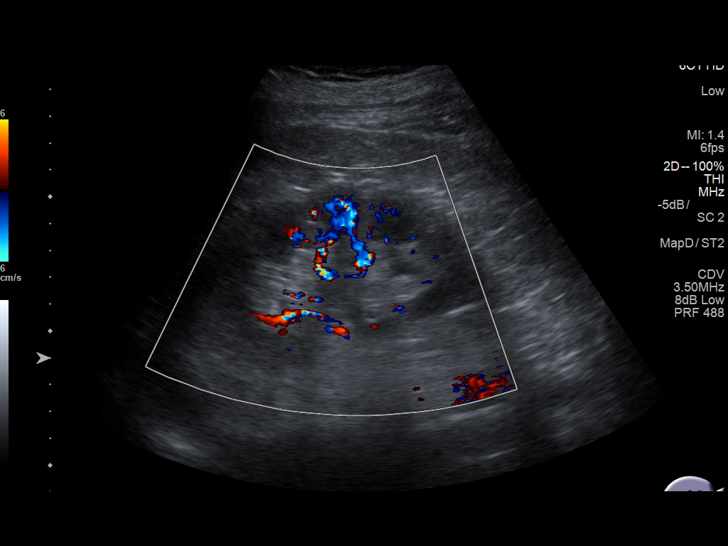
[im 8/45]
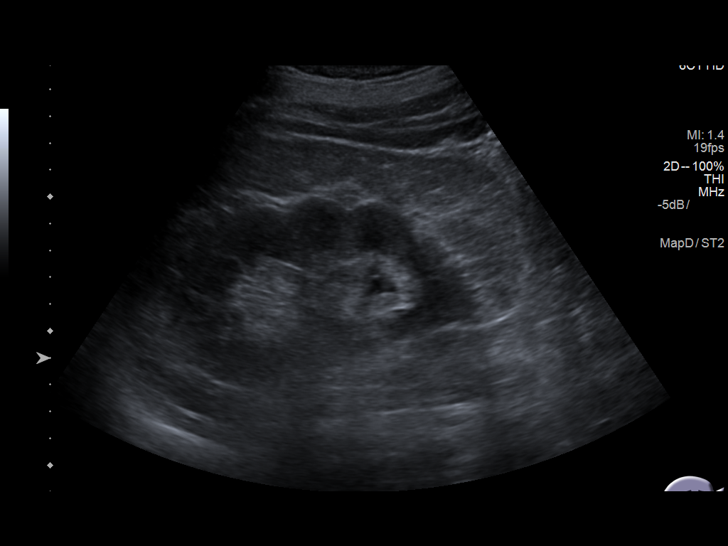
[im 12/45]
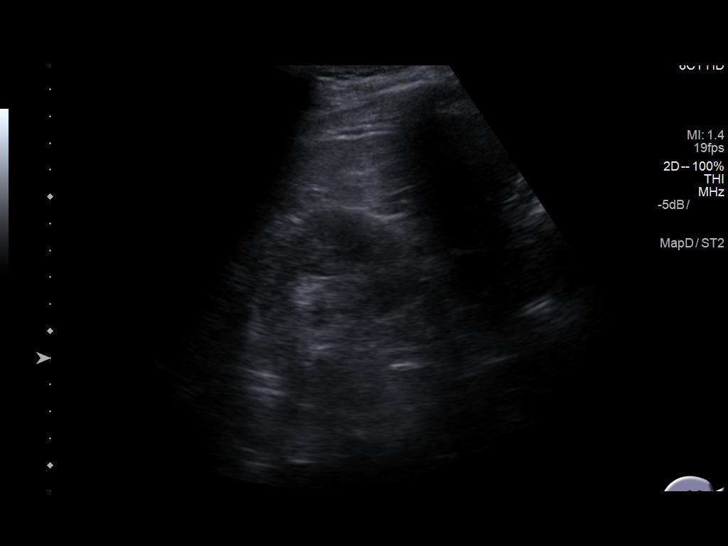
[im 15/45]
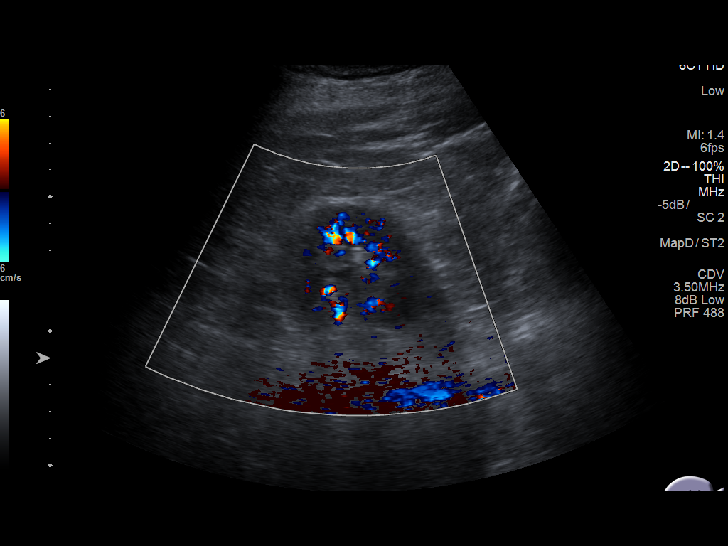
[im 17/45]
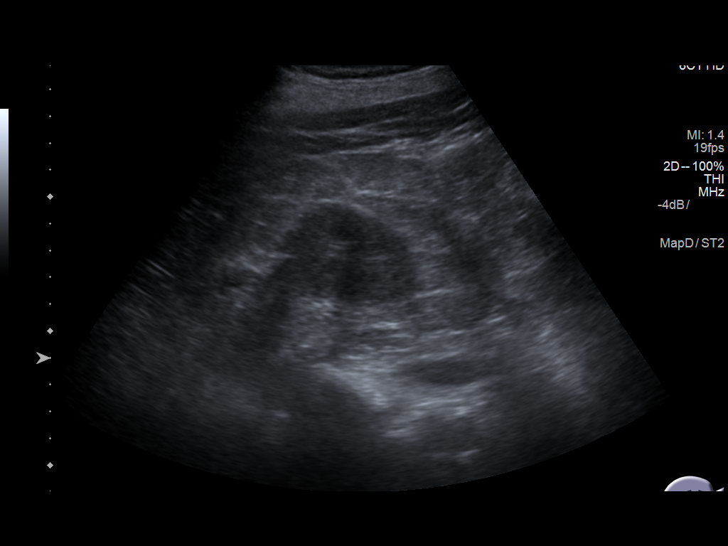
[im 21/45]
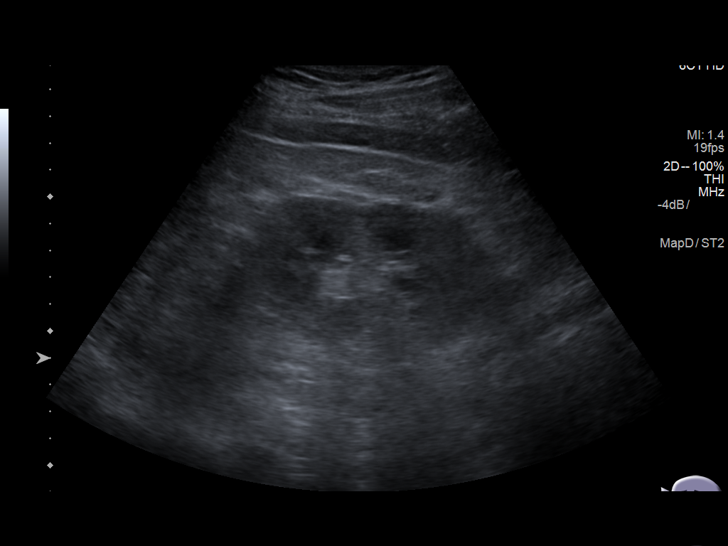
[im 24/45]
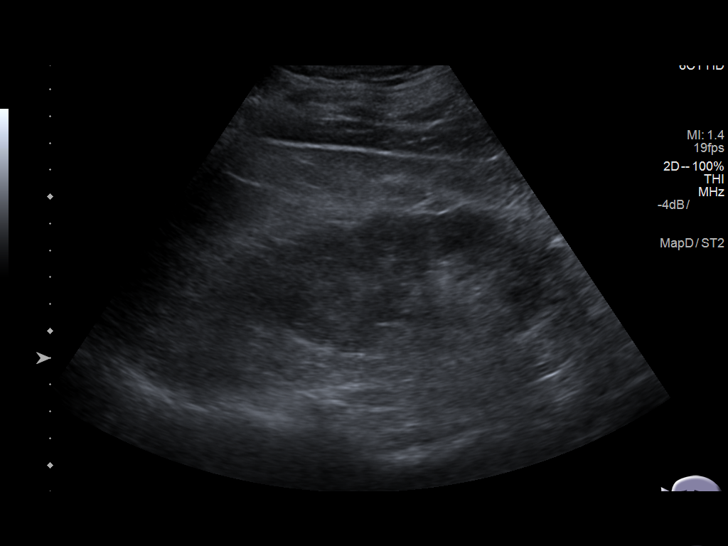
[im 28/45]
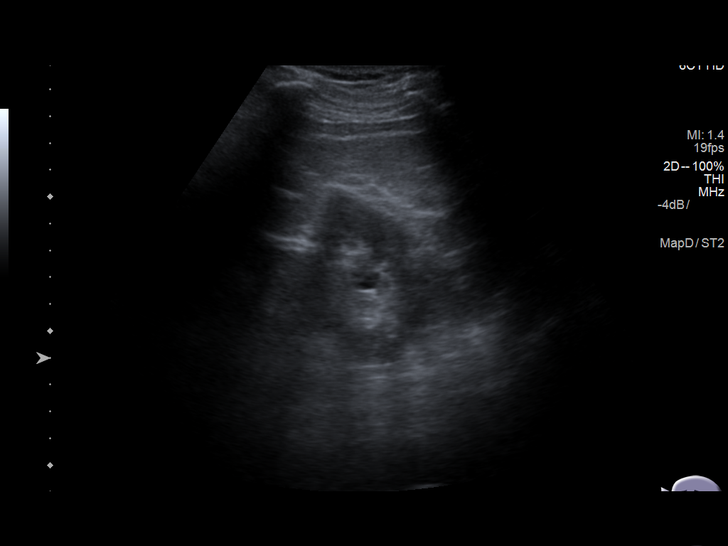
[im 30/45]
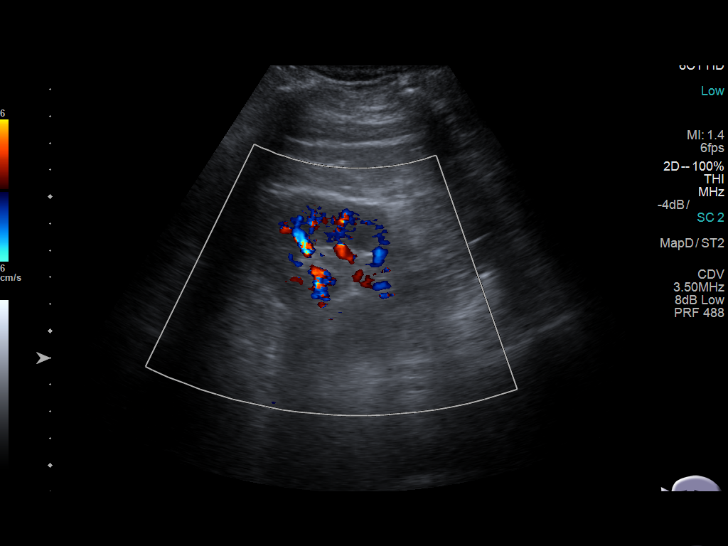
[im 34/45]
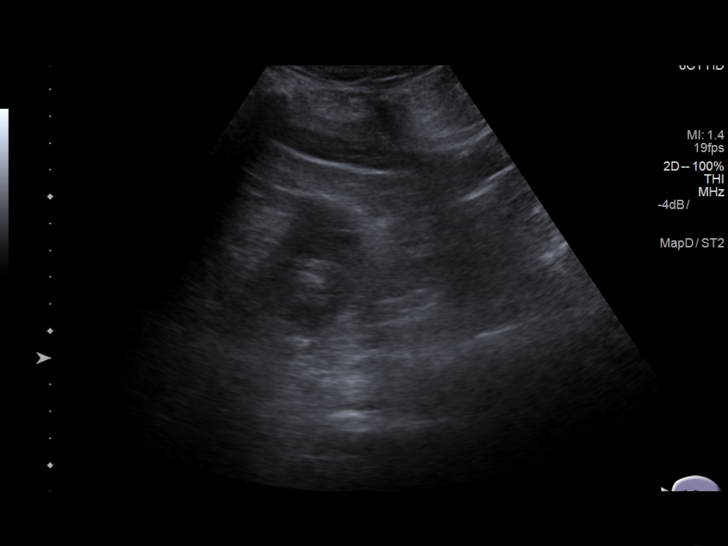
[im 37/45]
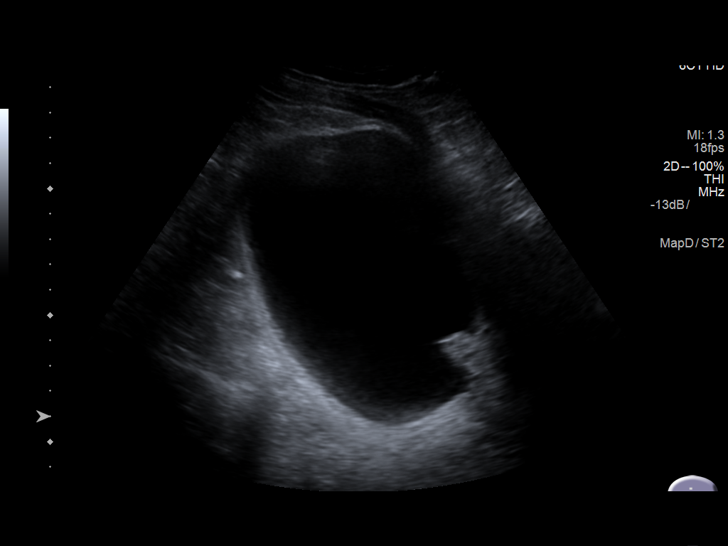
[im 41/45]
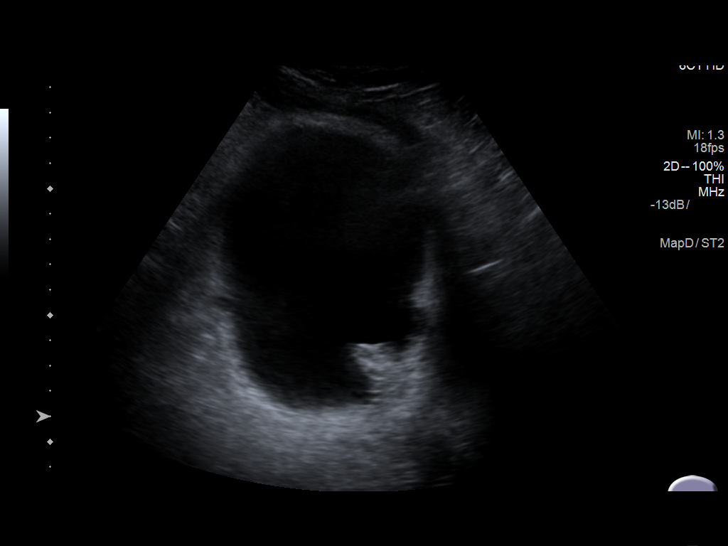
[im 45/45]
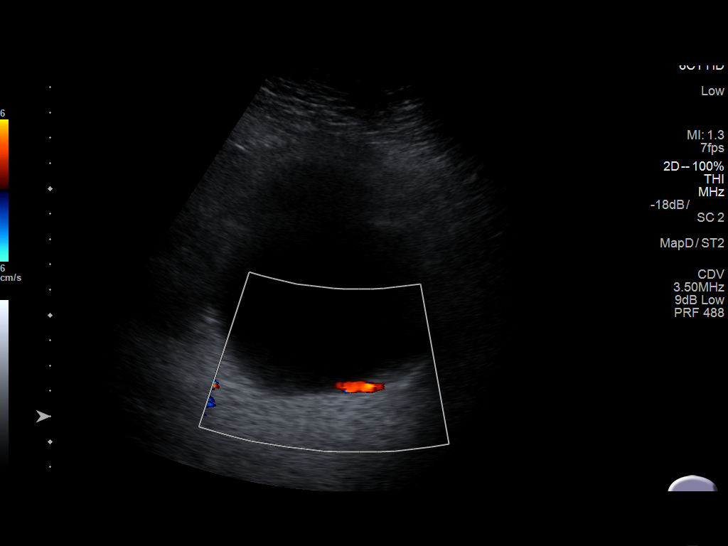

[14 of 25 positions shown; findings below may reference images not displayed]

FINDINGS: Right Kidney:

Length: 11.1 cm.. Mild right hydronephrosis which was not present
previously. No renal mass

Left Kidney:

Length: 11.8 cm.. Mild left hydronephrosis which was not present
previously. Left lower pole cyst 12 x 9 mm is stable.

Bladder:

Distended urinary bladder. Soft tissue mass in the base of the
bladder on the left measures 2.3 x 2.0 x 2.1 cm. This was not seen
previously. Previously there was a Foley catheter in the bladder
which is decompressed. Left ureteral jet identified. Right ureteral
jet not visualized
IMPRESSION: Mild hydronephrosis bilaterally likely due to a full urinary
bladder.

12 x 9 mm left lower pole cyst is stable

Bladder mass on the left may represent a neoplasm. Urologic
evaluation recommended.

## 2017-05-13 IMAGING — DX DG SHOULDER 2+V*L*
3 series · 3 of 3 positions shown · non-contrast
Comparison: None.

CLINICAL DATA: Injury 3 months ago.  Initial evaluation .

EXAM:
LEFT SHOULDER - 2+ VIEW

[shoulder grashey]
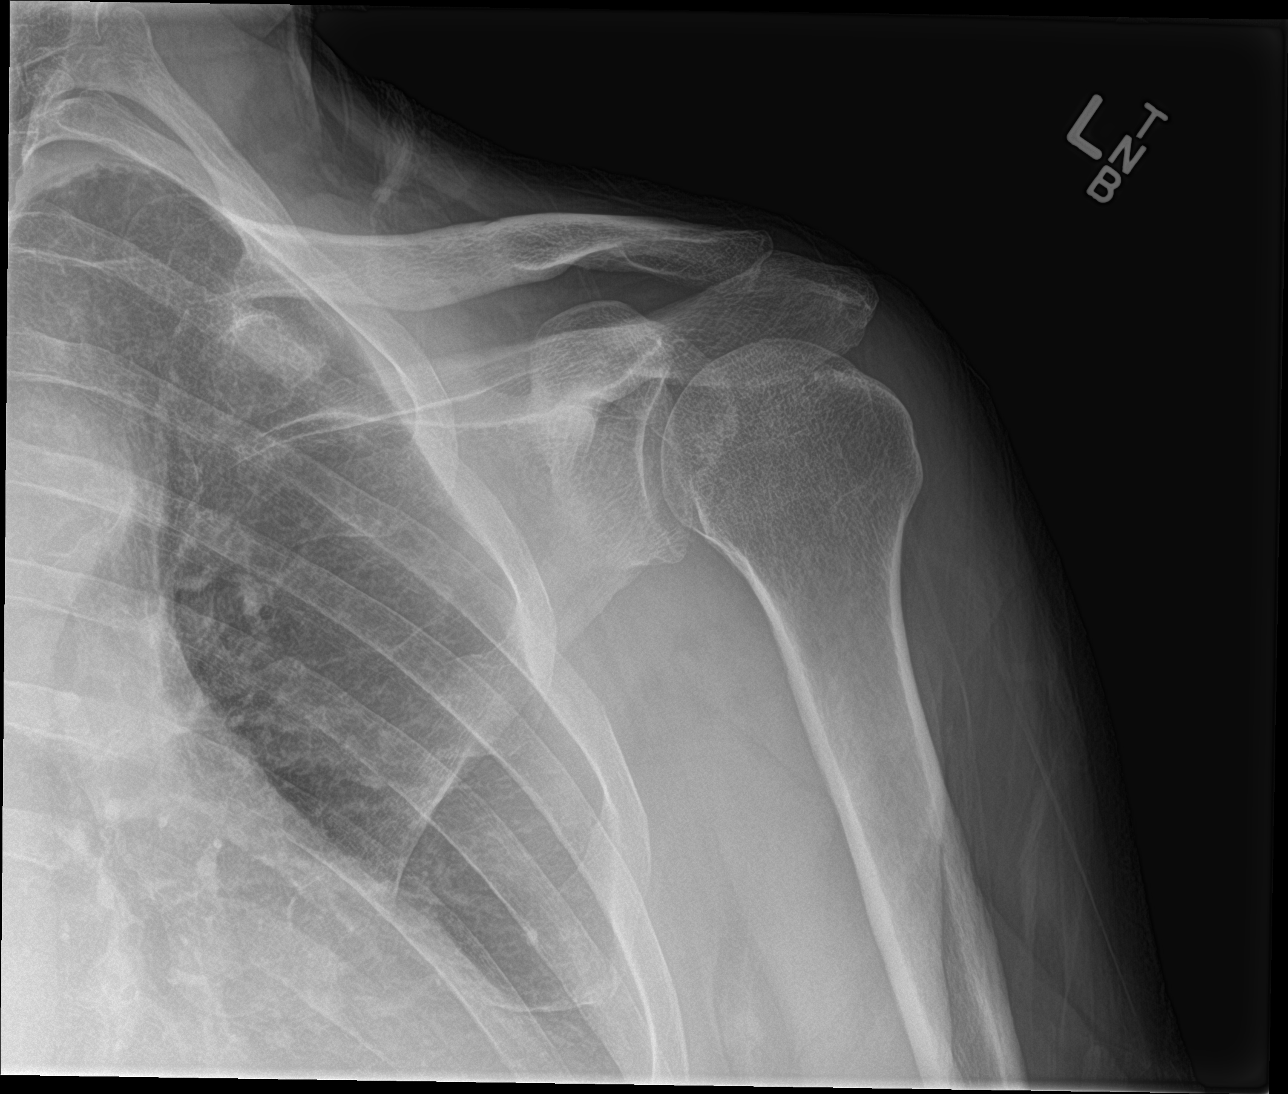

[shoulder y view]
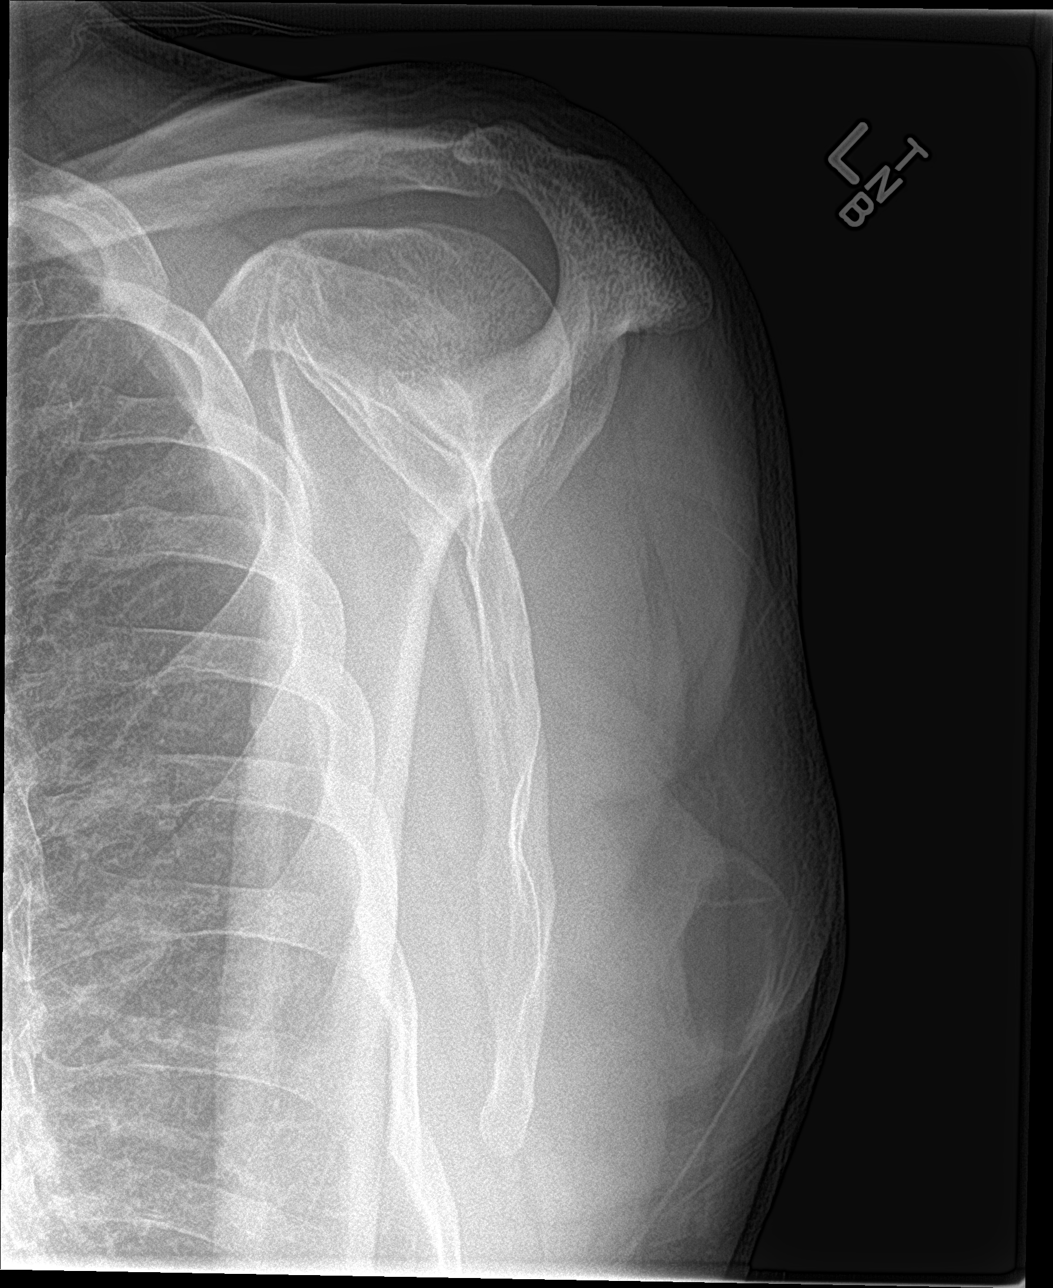

[shoulder axillary]
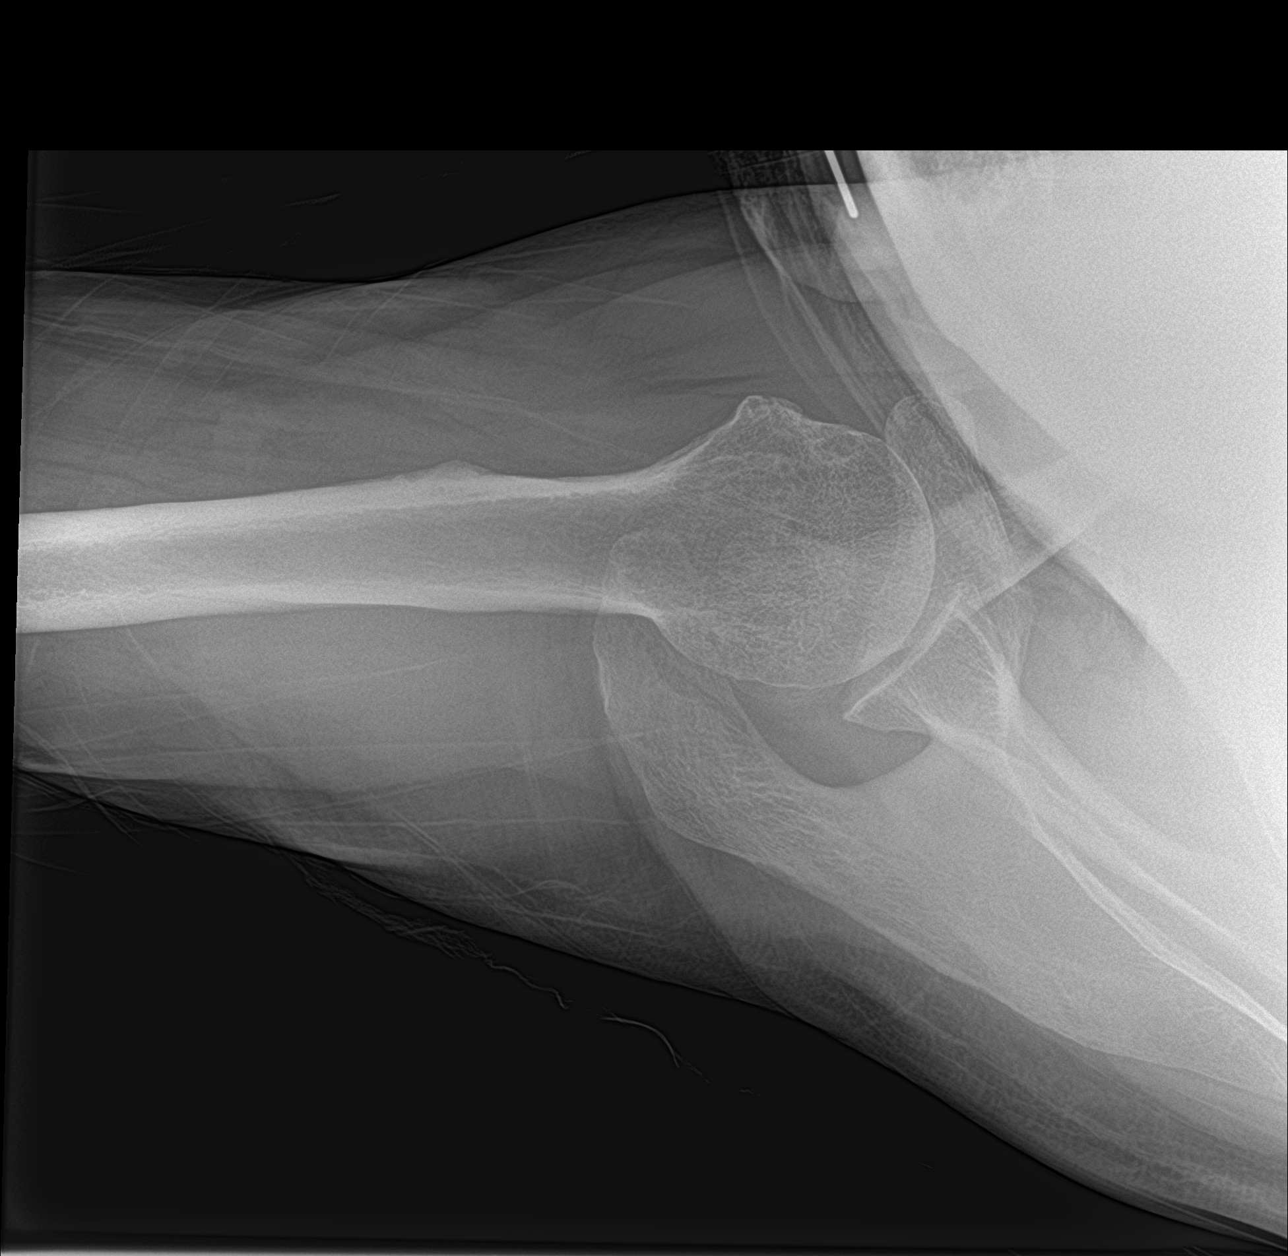

[3 of 3 positions shown; findings below may reference images not displayed]

FINDINGS: Mild acromioclavicular and glenohumeral degenerative change. No
acute bony abnormality. No evidence of fracture or dislocation.
IMPRESSION: No acute abnormality. Mild acromioclavicular and glenohumeral
degenerative change

## 2017-06-02 DIAGNOSIS — I2510 Atherosclerotic heart disease of native coronary artery without angina pectoris: Secondary | ICD-10-CM | POA: Diagnosis not present

## 2017-06-02 DIAGNOSIS — E785 Hyperlipidemia, unspecified: Secondary | ICD-10-CM | POA: Diagnosis not present

## 2017-06-02 DIAGNOSIS — Z79899 Other long term (current) drug therapy: Secondary | ICD-10-CM | POA: Diagnosis not present

## 2017-06-09 DIAGNOSIS — E785 Hyperlipidemia, unspecified: Secondary | ICD-10-CM | POA: Diagnosis not present

## 2017-06-09 DIAGNOSIS — I2510 Atherosclerotic heart disease of native coronary artery without angina pectoris: Secondary | ICD-10-CM | POA: Diagnosis not present

## 2017-06-28 IMAGING — CT CT ABD-PEL WO/W CM
2 of 11 series · 9 of 46 positions shown, 15 images · IV contrast (Omnipaque 300)
Comparison: No priors.

CLINICAL DATA: 69-year-old male with history of hydronephrosis
following TURP in 7912. No current hematuria.

EXAM:
CT ABDOMEN AND PELVIS WITHOUT AND WITH CONTRAST
TECHNIQUE: Multidetector CT imaging of the abdomen and pelvis was performed
following the standard protocol before and following the bolus
administration of intravenous contrast.
CONTRAST:  125mL OMNIPAQUE IOHEXOL 300 MG/ML  SOLN

[Series 3: hematuria pre contrast 5.0mm · axial · non-contrast · 0.78mm/px · z∈[-485,-105]mm · 7 of 102 slices shown, 12 images]
[im 13/102  soft-tissue]
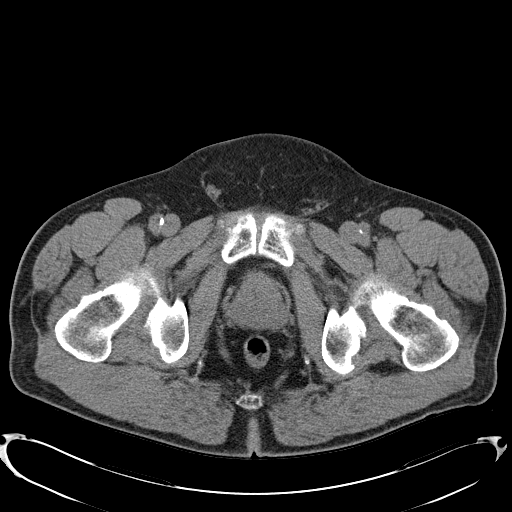
[im 13/102  bone]
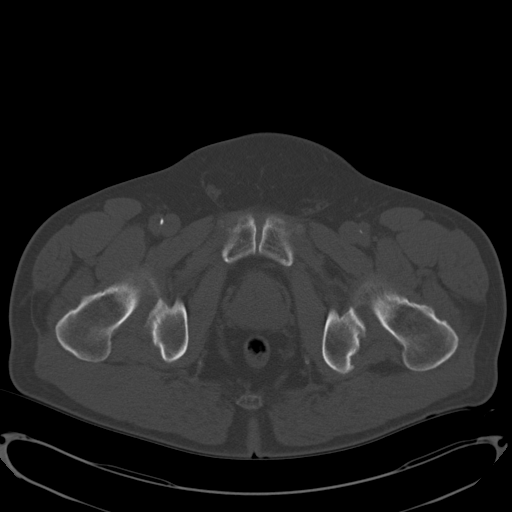
[im 26/102  soft-tissue]
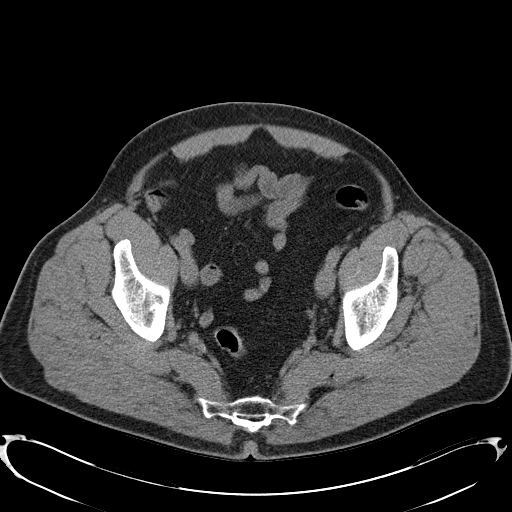
[im 38/102  soft-tissue]
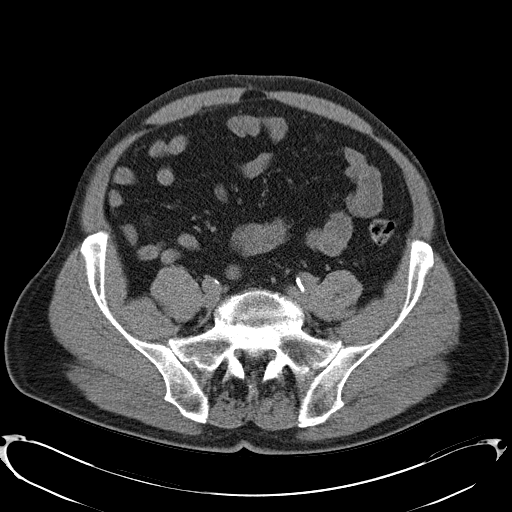
[im 51/102  soft-tissue]
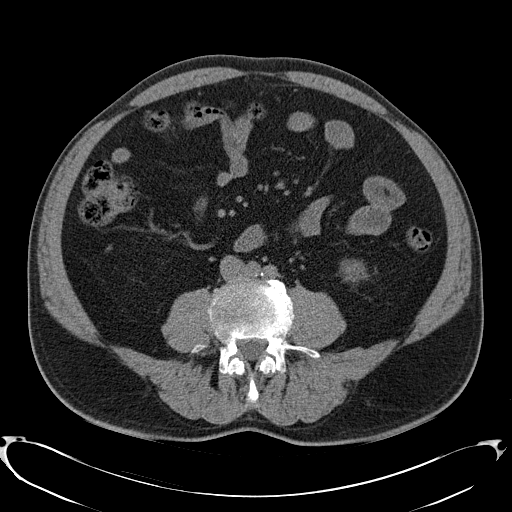
[im 51/102  lung]
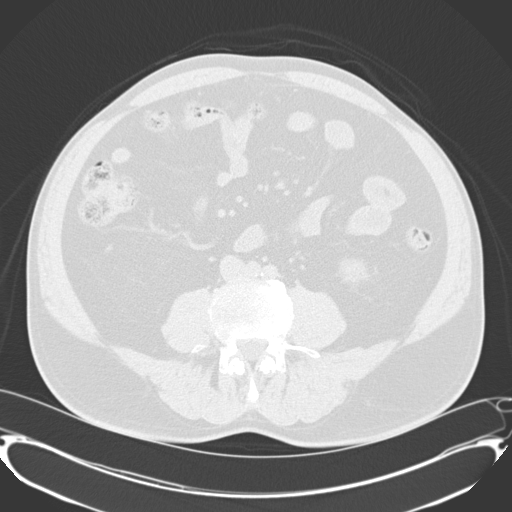
[im 64/102  soft-tissue]
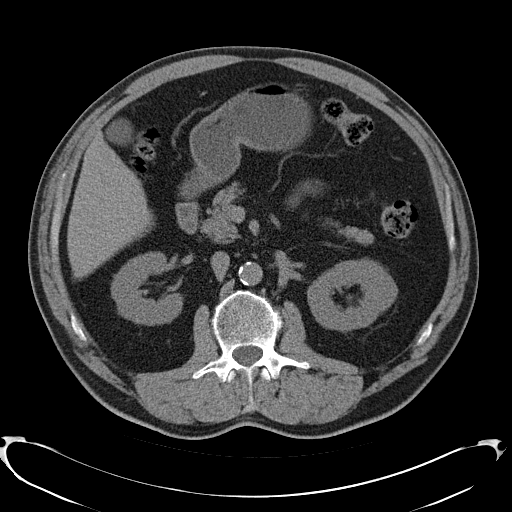
[im 64/102  lung]
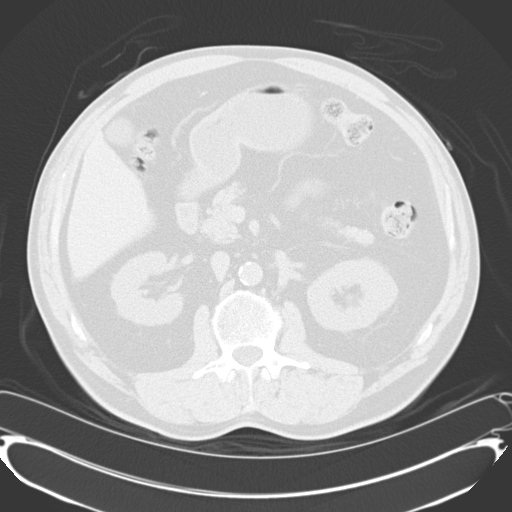
[im 76/102  soft-tissue]
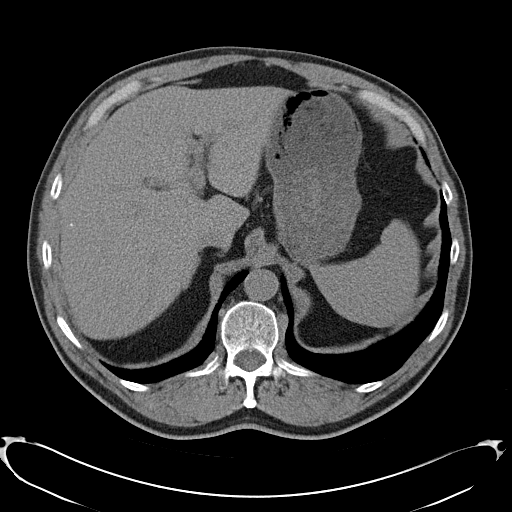
[im 76/102  lung]
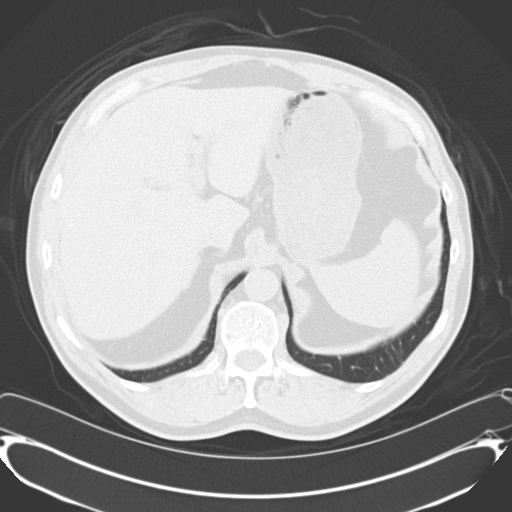
[im 89/102  soft-tissue]
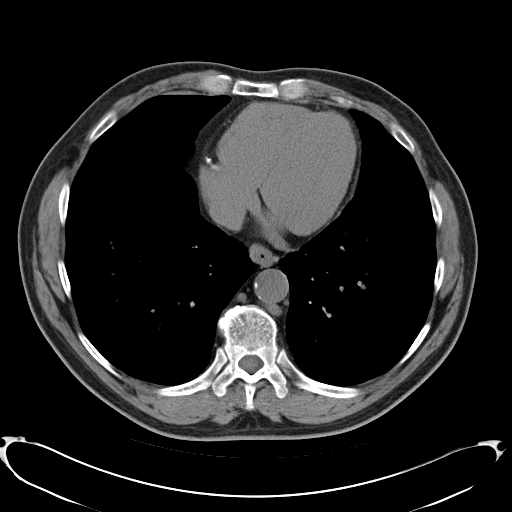
[im 89/102  lung]
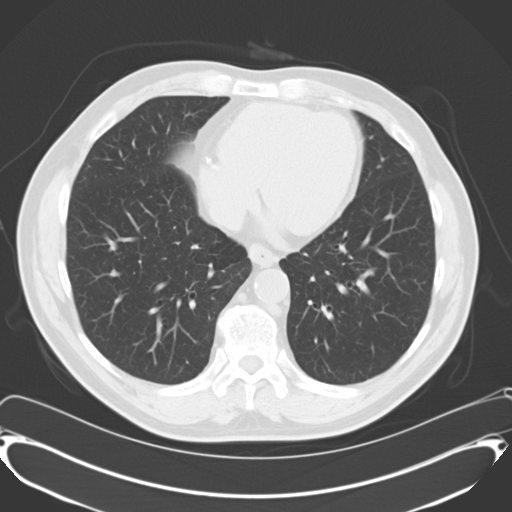

[Series 4: hematuria mpr coro pre 3.0mm · coronal · non-contrast · 0.77mm/px · 2 of 97 slices shown, 3 images]
[im 33/97  soft-tissue]
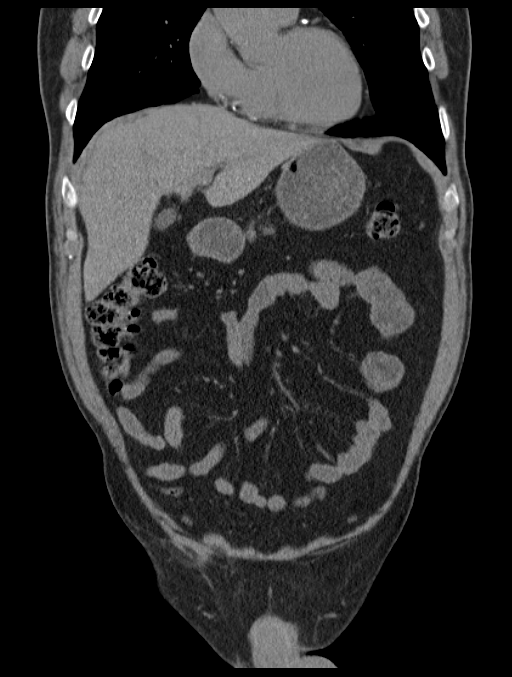
[im 33/97  bone]
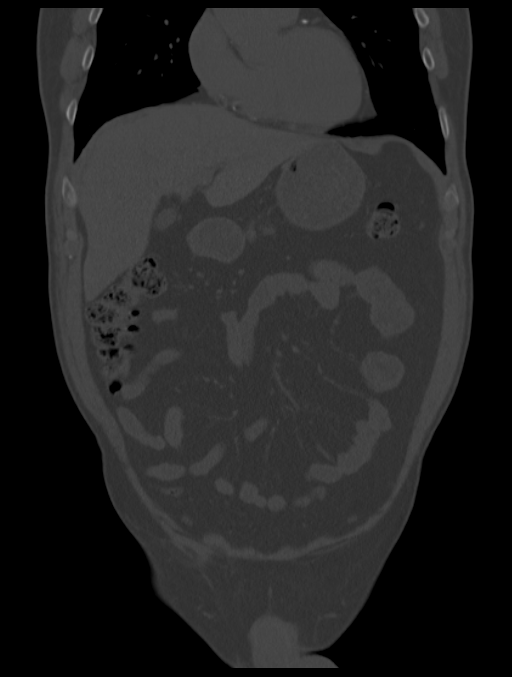
[im 65/97  soft-tissue]
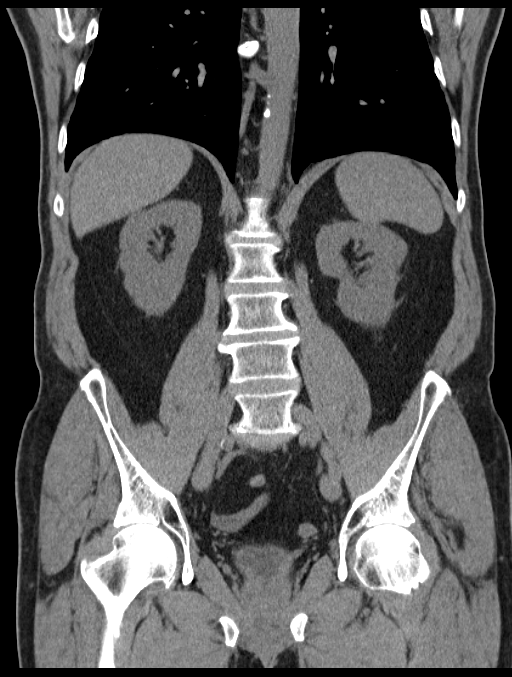

[9 of 46 positions shown; findings below may reference images not displayed]

FINDINGS: Lower chest: Atherosclerotic calcifications are noted in the distal
left main, left anterior descending, left circumflex and right
coronary arteries.

Hepatobiliary: Tiny calcified granuloma in the right lobe of the
liver. No suspicious cystic or solid hepatic lesions noted. No intra
or extrahepatic biliary ductal dilatation. Gallbladder is normal in
appearance.

Pancreas: No pancreatic mass. No pancreatic ductal dilatation. No
pancreatic or peripancreatic fluid or inflammatory changes.

Spleen: Unremarkable.

Adrenals/Urinary Tract: There are no calcifications identified
within the collecting system of either kidney, along the course of
either ureter, or within the lumen of the urinary bladder. No
hydroureteronephrosis or perinephric stranding to indicate urinary
tract obstruction at this time. 1 cm low-attenuation lesion in the
lower pole of the left kidney is compatible with a simple cyst. Sub
cm low-attenuation lesion in the interpolar region of the right
kidney is too small to definitively characterize, but statistically
likely a tiny cyst. On postcontrast delayed images there are no
definite filling defects within the collecting system of either
kidney, along the course of either ureter or within the lumen of the
urinary bladder to strongly suggest presence of a urothelial
neoplasm at this time. Bilateral adrenal glands are normal in
appearance.

Stomach/Bowel: Normal appearance of the stomach. No pathologic
dilatation of small bowel or colon. Appendix is not visualized,
likely surgically absent. Regardless, there are no inflammatory
changes noted adjacent to the cecum to suggest presence of an acute
appendicitis at this time.

Vascular/Lymphatic: Atherosclerosis throughout the abdominal and
pelvic vasculature, without evidence of aneurysm or dissection. No
lymphadenopathy noted in the abdomen or pelvis.

Reproductive: Postoperative changes of TURP are noted in the
prostate gland. There is a small amount of tissue adjacent to the
surgical defect which extends cephalad into the inferior aspect of
the urinary bladder on the left side, presumably reflective of
residual median lobe hypertrophy. Seminal vesicles are unremarkable
in appearance.

Other: No significant volume of ascites.  No pneumoperitoneum.

Musculoskeletal: There are no aggressive appearing lytic or blastic
lesions noted in the visualized portions of the skeleton.
IMPRESSION: 1. No residual hydroureteronephrosis noted.
2. Postprocedural changes of TURP noted. There is with small amount
of soft tissue which extends cephalad from the prostate gland into
the lower aspect of the urinary bladder slightly to the left of
midline, presumably residual median lobe hypertrophy.
3. No urinary tract calculi.
4. Small simple cyst in the lower pole left kidney. Sub cm lesion in
the interpolar region of the right kidney is too small to
characterize, but is also likely to represent a tiny cyst.
5. Atherosclerosis, including left main and 3 vessel coronary artery
disease. Assessment for potential risk factor modification, dietary
therapy or pharmacologic therapy may be warranted, if clinically
indicated.
6. Additional incidental findings, as above.

## 2017-11-10 DIAGNOSIS — I10 Essential (primary) hypertension: Secondary | ICD-10-CM | POA: Diagnosis not present

## 2017-11-10 DIAGNOSIS — I2510 Atherosclerotic heart disease of native coronary artery without angina pectoris: Secondary | ICD-10-CM | POA: Diagnosis not present

## 2018-05-11 DIAGNOSIS — I2510 Atherosclerotic heart disease of native coronary artery without angina pectoris: Secondary | ICD-10-CM | POA: Diagnosis not present

## 2018-05-11 DIAGNOSIS — N400 Enlarged prostate without lower urinary tract symptoms: Secondary | ICD-10-CM | POA: Diagnosis not present

## 2018-05-11 DIAGNOSIS — I10 Essential (primary) hypertension: Secondary | ICD-10-CM | POA: Diagnosis not present

## 2018-05-29 DIAGNOSIS — I2510 Atherosclerotic heart disease of native coronary artery without angina pectoris: Secondary | ICD-10-CM | POA: Diagnosis not present

## 2018-05-29 DIAGNOSIS — I700 Atherosclerosis of aorta: Secondary | ICD-10-CM | POA: Diagnosis not present

## 2018-06-22 DIAGNOSIS — I2510 Atherosclerotic heart disease of native coronary artery without angina pectoris: Secondary | ICD-10-CM | POA: Diagnosis not present

## 2018-06-22 DIAGNOSIS — Z79899 Other long term (current) drug therapy: Secondary | ICD-10-CM | POA: Diagnosis not present

## 2018-06-22 DIAGNOSIS — I10 Essential (primary) hypertension: Secondary | ICD-10-CM | POA: Diagnosis not present

## 2018-06-22 DIAGNOSIS — E785 Hyperlipidemia, unspecified: Secondary | ICD-10-CM | POA: Diagnosis not present

## 2018-06-29 DIAGNOSIS — N183 Chronic kidney disease, stage 3 (moderate): Secondary | ICD-10-CM | POA: Diagnosis not present

## 2018-06-29 DIAGNOSIS — E875 Hyperkalemia: Secondary | ICD-10-CM | POA: Diagnosis not present

## 2018-07-27 DIAGNOSIS — Z79899 Other long term (current) drug therapy: Secondary | ICD-10-CM | POA: Diagnosis not present

## 2018-07-27 DIAGNOSIS — N183 Chronic kidney disease, stage 3 (moderate): Secondary | ICD-10-CM | POA: Diagnosis not present

## 2018-07-27 DIAGNOSIS — E875 Hyperkalemia: Secondary | ICD-10-CM | POA: Diagnosis not present

## 2018-08-03 DIAGNOSIS — E875 Hyperkalemia: Secondary | ICD-10-CM | POA: Diagnosis not present

## 2018-08-03 DIAGNOSIS — Z6830 Body mass index (BMI) 30.0-30.9, adult: Secondary | ICD-10-CM | POA: Diagnosis not present

## 2018-08-03 DIAGNOSIS — N189 Chronic kidney disease, unspecified: Secondary | ICD-10-CM | POA: Diagnosis not present

## 2018-10-30 ENCOUNTER — Other Ambulatory Visit: Payer: Self-pay | Admitting: Internal Medicine

## 2018-10-30 ENCOUNTER — Other Ambulatory Visit (HOSPITAL_COMMUNITY): Payer: Self-pay | Admitting: Internal Medicine

## 2018-10-30 ENCOUNTER — Other Ambulatory Visit: Payer: Self-pay

## 2018-10-30 ENCOUNTER — Ambulatory Visit (HOSPITAL_COMMUNITY)
Admission: RE | Admit: 2018-10-30 | Discharge: 2018-10-30 | Disposition: A | Discharge status: Home / Self Care | Payer: Medicare Other | Source: Ambulatory Visit | Attending: Internal Medicine | Admitting: Internal Medicine

## 2018-10-30 DIAGNOSIS — G459 Transient cerebral ischemic attack, unspecified: Secondary | ICD-10-CM

## 2018-10-30 DIAGNOSIS — I672 Cerebral atherosclerosis: Secondary | ICD-10-CM | POA: Diagnosis not present

## 2018-10-30 DIAGNOSIS — Z8673 Personal history of transient ischemic attack (TIA), and cerebral infarction without residual deficits: Secondary | ICD-10-CM | POA: Diagnosis not present

## 2018-11-07 DIAGNOSIS — I10 Essential (primary) hypertension: Secondary | ICD-10-CM | POA: Diagnosis not present

## 2018-11-07 DIAGNOSIS — Z8673 Personal history of transient ischemic attack (TIA), and cerebral infarction without residual deficits: Secondary | ICD-10-CM | POA: Diagnosis not present

## 2018-11-09 ENCOUNTER — Other Ambulatory Visit (HOSPITAL_COMMUNITY): Payer: Self-pay | Admitting: Internal Medicine

## 2018-11-09 DIAGNOSIS — I639 Cerebral infarction, unspecified: Secondary | ICD-10-CM

## 2018-11-14 ENCOUNTER — Ambulatory Visit (HOSPITAL_COMMUNITY)
Admission: RE | Admit: 2018-11-14 | Discharge: 2018-11-14 | Disposition: A | Discharge status: Home / Self Care | Payer: Medicare Other | Source: Ambulatory Visit | Attending: Internal Medicine | Admitting: Internal Medicine

## 2018-11-14 ENCOUNTER — Other Ambulatory Visit: Payer: Self-pay

## 2018-11-14 DIAGNOSIS — E785 Hyperlipidemia, unspecified: Secondary | ICD-10-CM | POA: Diagnosis not present

## 2018-11-14 DIAGNOSIS — I639 Cerebral infarction, unspecified: Secondary | ICD-10-CM | POA: Insufficient documentation

## 2018-11-14 DIAGNOSIS — I10 Essential (primary) hypertension: Secondary | ICD-10-CM | POA: Insufficient documentation

## 2018-11-14 DIAGNOSIS — K219 Gastro-esophageal reflux disease without esophagitis: Secondary | ICD-10-CM | POA: Diagnosis not present

## 2018-11-14 NOTE — Progress Notes (Signed)
*  PRELIMINARY RESULTS* Echocardiogram 2D Echocardiogram has been performed.  Connor [name] 11/14/2018, 9:16 AM

## 2018-11-16 DIAGNOSIS — R001 Bradycardia, unspecified: Secondary | ICD-10-CM | POA: Diagnosis not present

## 2018-11-16 DIAGNOSIS — I480 Paroxysmal atrial fibrillation: Secondary | ICD-10-CM | POA: Diagnosis not present

## 2018-11-27 ENCOUNTER — Other Ambulatory Visit (HOSPITAL_COMMUNITY): Payer: Self-pay | Admitting: Neurology

## 2018-11-27 ENCOUNTER — Other Ambulatory Visit: Payer: Self-pay | Admitting: Neurology

## 2018-11-27 DIAGNOSIS — G40219 Localization-related (focal) (partial) symptomatic epilepsy and epileptic syndromes with complex partial seizures, intractable, without status epilepticus: Secondary | ICD-10-CM | POA: Diagnosis not present

## 2018-11-27 DIAGNOSIS — I4819 Other persistent atrial fibrillation: Secondary | ICD-10-CM | POA: Diagnosis not present

## 2018-11-27 DIAGNOSIS — I639 Cerebral infarction, unspecified: Secondary | ICD-10-CM

## 2018-11-27 DIAGNOSIS — I69398 Other sequelae of cerebral infarction: Secondary | ICD-10-CM | POA: Diagnosis not present

## 2018-11-27 DIAGNOSIS — I10 Essential (primary) hypertension: Secondary | ICD-10-CM | POA: Diagnosis not present

## 2018-12-03 ENCOUNTER — Ambulatory Visit (HOSPITAL_COMMUNITY)
Admission: RE | Admit: 2018-12-03 | Discharge: 2018-12-03 | Disposition: A | Discharge status: Home / Self Care | Payer: Medicare Other | Source: Ambulatory Visit | Attending: Neurology | Admitting: Neurology

## 2018-12-03 ENCOUNTER — Other Ambulatory Visit: Payer: Self-pay

## 2018-12-03 DIAGNOSIS — I639 Cerebral infarction, unspecified: Secondary | ICD-10-CM

## 2018-12-03 DIAGNOSIS — I6523 Occlusion and stenosis of bilateral carotid arteries: Secondary | ICD-10-CM | POA: Diagnosis not present

## 2018-12-05 DIAGNOSIS — Z7901 Long term (current) use of anticoagulants: Secondary | ICD-10-CM | POA: Diagnosis not present

## 2018-12-05 DIAGNOSIS — I4891 Unspecified atrial fibrillation: Secondary | ICD-10-CM | POA: Diagnosis not present

## 2018-12-10 DIAGNOSIS — Z8673 Personal history of transient ischemic attack (TIA), and cerebral infarction without residual deficits: Secondary | ICD-10-CM | POA: Diagnosis not present

## 2018-12-10 DIAGNOSIS — I480 Paroxysmal atrial fibrillation: Secondary | ICD-10-CM | POA: Diagnosis not present

## 2018-12-14 DIAGNOSIS — G40219 Localization-related (focal) (partial) symptomatic epilepsy and epileptic syndromes with complex partial seizures, intractable, without status epilepticus: Secondary | ICD-10-CM | POA: Diagnosis not present

## 2018-12-26 DIAGNOSIS — I480 Paroxysmal atrial fibrillation: Secondary | ICD-10-CM | POA: Diagnosis not present

## 2018-12-26 DIAGNOSIS — Z8673 Personal history of transient ischemic attack (TIA), and cerebral infarction without residual deficits: Secondary | ICD-10-CM | POA: Diagnosis not present

## 2019-02-25 DIAGNOSIS — I10 Essential (primary) hypertension: Secondary | ICD-10-CM | POA: Diagnosis not present

## 2019-02-25 DIAGNOSIS — I69398 Other sequelae of cerebral infarction: Secondary | ICD-10-CM | POA: Diagnosis not present

## 2019-02-25 DIAGNOSIS — G40219 Localization-related (focal) (partial) symptomatic epilepsy and epileptic syndromes with complex partial seizures, intractable, without status epilepticus: Secondary | ICD-10-CM | POA: Diagnosis not present

## 2019-02-25 DIAGNOSIS — G250 Essential tremor: Secondary | ICD-10-CM | POA: Diagnosis not present

## 2019-04-03 DIAGNOSIS — Z7901 Long term (current) use of anticoagulants: Secondary | ICD-10-CM | POA: Diagnosis not present

## 2019-04-03 DIAGNOSIS — G464 Cerebellar stroke syndrome: Secondary | ICD-10-CM | POA: Diagnosis not present

## 2019-04-03 DIAGNOSIS — I480 Paroxysmal atrial fibrillation: Secondary | ICD-10-CM | POA: Diagnosis not present

## 2019-04-03 DIAGNOSIS — E785 Hyperlipidemia, unspecified: Secondary | ICD-10-CM | POA: Diagnosis not present

## 2019-04-03 DIAGNOSIS — Z79899 Other long term (current) drug therapy: Secondary | ICD-10-CM | POA: Diagnosis not present

## 2019-04-03 DIAGNOSIS — I2510 Atherosclerotic heart disease of native coronary artery without angina pectoris: Secondary | ICD-10-CM | POA: Diagnosis not present

## 2019-04-12 DIAGNOSIS — E785 Hyperlipidemia, unspecified: Secondary | ICD-10-CM | POA: Diagnosis not present

## 2019-04-12 DIAGNOSIS — I480 Paroxysmal atrial fibrillation: Secondary | ICD-10-CM | POA: Diagnosis not present

## 2019-05-10 ENCOUNTER — Other Ambulatory Visit: Payer: Self-pay

## 2019-05-10 DIAGNOSIS — Z20822 Contact with and (suspected) exposure to covid-19: Secondary | ICD-10-CM

## 2019-05-10 DIAGNOSIS — Z20828 Contact with and (suspected) exposure to other viral communicable diseases: Secondary | ICD-10-CM | POA: Diagnosis not present

## 2019-05-12 LAB — NOVEL CORONAVIRUS, NAA: SARS-CoV-2, NAA: NOT DETECTED

## 2019-08-20 DIAGNOSIS — I480 Paroxysmal atrial fibrillation: Secondary | ICD-10-CM | POA: Diagnosis not present

## 2019-08-20 DIAGNOSIS — I2510 Atherosclerotic heart disease of native coronary artery without angina pectoris: Secondary | ICD-10-CM | POA: Diagnosis not present

## 2019-08-26 DIAGNOSIS — Z23 Encounter for immunization: Secondary | ICD-10-CM | POA: Diagnosis not present

## 2019-09-24 DIAGNOSIS — Z23 Encounter for immunization: Secondary | ICD-10-CM | POA: Diagnosis not present

## 2019-11-08 DIAGNOSIS — H43812 Vitreous degeneration, left eye: Secondary | ICD-10-CM | POA: Diagnosis not present

## 2019-12-20 DIAGNOSIS — I2510 Atherosclerotic heart disease of native coronary artery without angina pectoris: Secondary | ICD-10-CM | POA: Diagnosis not present

## 2019-12-20 DIAGNOSIS — I480 Paroxysmal atrial fibrillation: Secondary | ICD-10-CM | POA: Diagnosis not present

## 2020-03-19 DIAGNOSIS — Z23 Encounter for immunization: Secondary | ICD-10-CM | POA: Diagnosis not present

## 2020-04-22 DIAGNOSIS — I6300 Cerebral infarction due to thrombosis of unspecified precerebral artery: Secondary | ICD-10-CM | POA: Diagnosis not present

## 2020-04-22 DIAGNOSIS — I2510 Atherosclerotic heart disease of native coronary artery without angina pectoris: Secondary | ICD-10-CM | POA: Diagnosis not present

## 2020-04-22 DIAGNOSIS — E785 Hyperlipidemia, unspecified: Secondary | ICD-10-CM | POA: Diagnosis not present

## 2020-04-22 DIAGNOSIS — E119 Type 2 diabetes mellitus without complications: Secondary | ICD-10-CM | POA: Diagnosis not present

## 2020-04-22 DIAGNOSIS — I480 Paroxysmal atrial fibrillation: Secondary | ICD-10-CM | POA: Diagnosis not present

## 2020-04-22 DIAGNOSIS — Z79899 Other long term (current) drug therapy: Secondary | ICD-10-CM | POA: Diagnosis not present

## 2020-04-28 DIAGNOSIS — I2510 Atherosclerotic heart disease of native coronary artery without angina pectoris: Secondary | ICD-10-CM | POA: Diagnosis not present

## 2020-04-28 DIAGNOSIS — I480 Paroxysmal atrial fibrillation: Secondary | ICD-10-CM | POA: Diagnosis not present

## 2020-04-28 DIAGNOSIS — E785 Hyperlipidemia, unspecified: Secondary | ICD-10-CM | POA: Diagnosis not present

## 2020-04-28 DIAGNOSIS — N1832 Chronic kidney disease, stage 3b: Secondary | ICD-10-CM | POA: Diagnosis not present

## 2020-05-22 DIAGNOSIS — Z23 Encounter for immunization: Secondary | ICD-10-CM | POA: Diagnosis not present

## 2020-08-19 DIAGNOSIS — E785 Hyperlipidemia, unspecified: Secondary | ICD-10-CM | POA: Diagnosis not present

## 2020-08-19 DIAGNOSIS — N183 Chronic kidney disease, stage 3 unspecified: Secondary | ICD-10-CM | POA: Diagnosis not present

## 2020-08-19 DIAGNOSIS — I2510 Atherosclerotic heart disease of native coronary artery without angina pectoris: Secondary | ICD-10-CM | POA: Diagnosis not present

## 2020-08-19 DIAGNOSIS — Z79899 Other long term (current) drug therapy: Secondary | ICD-10-CM | POA: Diagnosis not present

## 2020-08-19 DIAGNOSIS — I480 Paroxysmal atrial fibrillation: Secondary | ICD-10-CM | POA: Diagnosis not present

## 2020-08-26 DIAGNOSIS — I480 Paroxysmal atrial fibrillation: Secondary | ICD-10-CM | POA: Diagnosis not present

## 2020-08-26 DIAGNOSIS — N1831 Chronic kidney disease, stage 3a: Secondary | ICD-10-CM | POA: Diagnosis not present

## 2020-10-18 IMAGING — MR MRI HEAD WITHOUT CONTRAST
8 of 11 series · 24 of 48 positions shown · non-contrast
Comparison: No prior cross-sectional imaging of the brain.

CLINICAL DATA: Weakness, possible TIA.

EXAM:
MRI HEAD WITHOUT CONTRAST
MRA HEAD WITHOUT CONTRAST
TECHNIQUE: Multiplanar, multiecho pulse sequences of the brain and surrounding
structures were obtained without intravenous contrast. Angiographic
images of the head were obtained using MRA technique without
contrast.

[Series 2: DWI · axial · 3.0mm · 0.75mm/px · z∈[-82,+80]mm · 5 of 55 slices shown (1 of 2)]
[im 1/55]
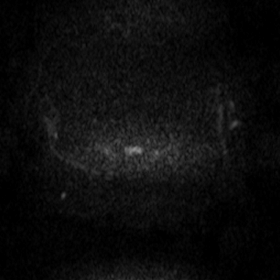
[im 14/55]
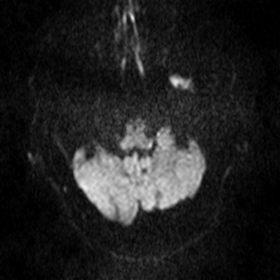
[im 28/55]
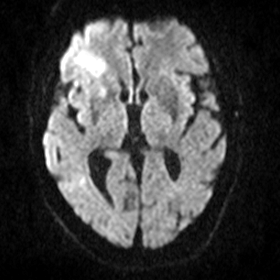
[im 41/55]
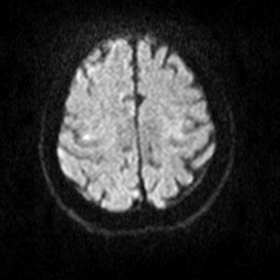
[im 55/55]
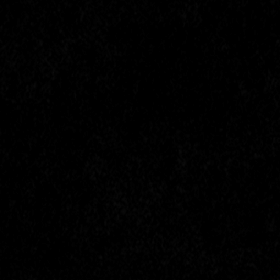

[Series 4: DWI · coronal · 5.0mm · 0.48mm/px · 3 of 38 slices shown (2 of 2)]
[im 1/38]
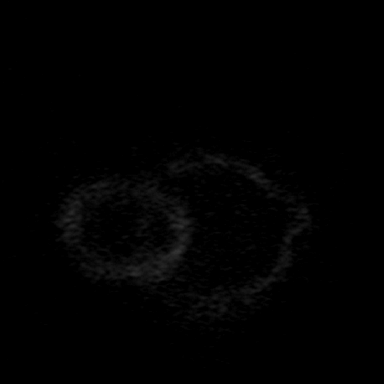
[im 19/38]
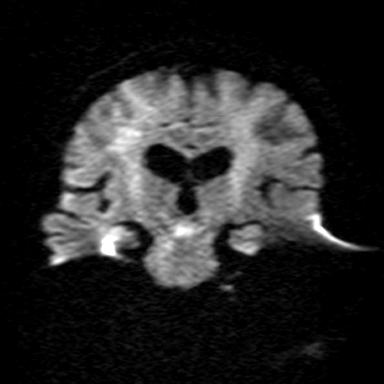
[im 38/38]
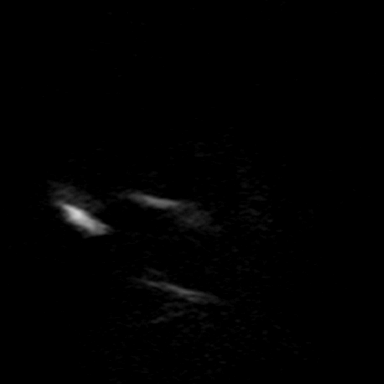

[Series 6: T1 · sagittal · 5.0mm · 0.41mm/px · 2 of 21 slices shown (1 of 2)]
[im 1/21]
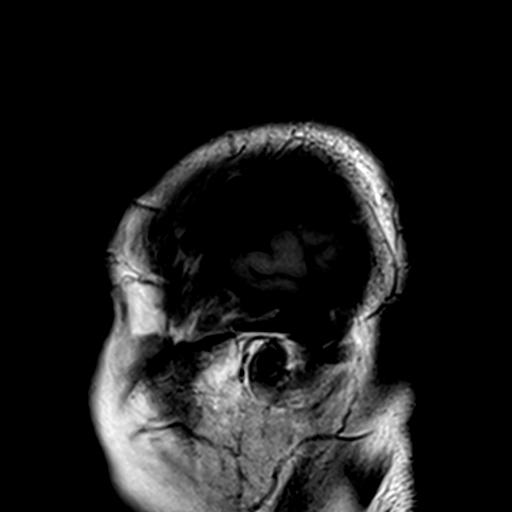
[im 21/21]
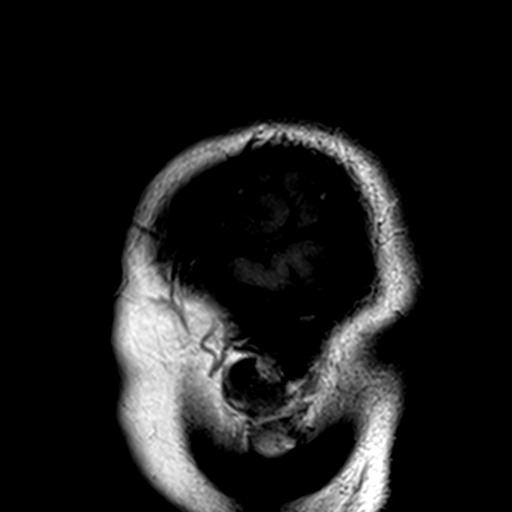

[Series 11: T2 · axial · 5.0mm · 0.48mm/px · z∈[-73,+70]mm · 2 of 23 slices shown (1 of 3)]
[im 1/23]
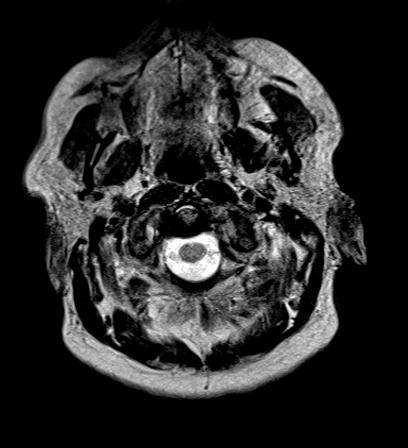
[im 23/23]
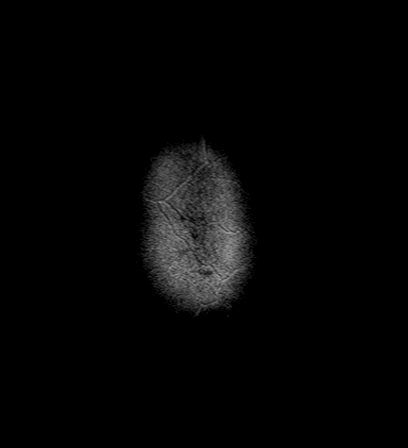

[Series 13: T2 · axial · 4.0mm · 0.43mm/px · z∈[-56,+54]mm · 2 of 23 slices shown (2 of 3)]
[im 1/23]
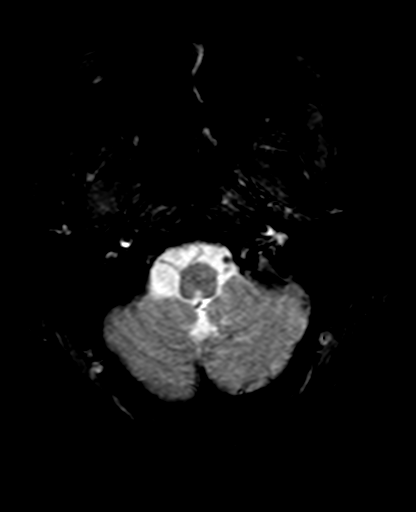
[im 23/23]
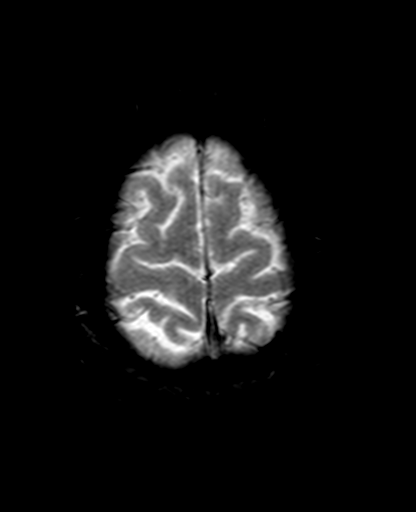

[Series 14: FLAIR · axial · 3.0mm · 0.32mm/px · z∈[-70,+68]mm · 4 of 47 slices shown]
[im 1/47]
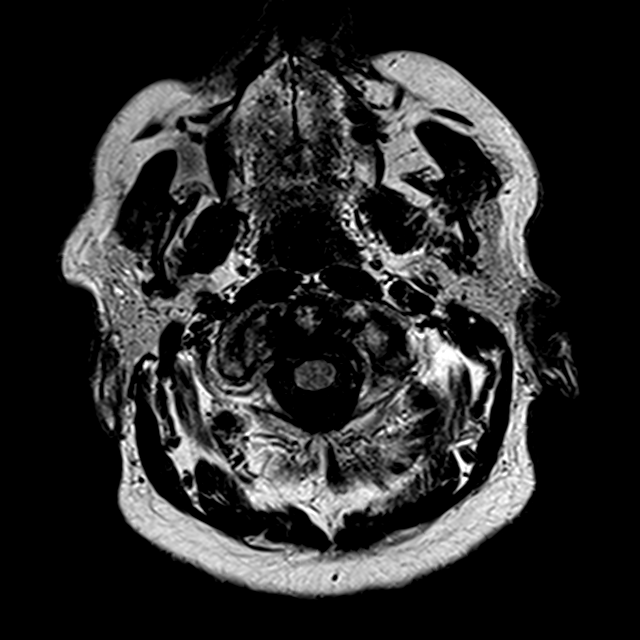
[im 16/47]
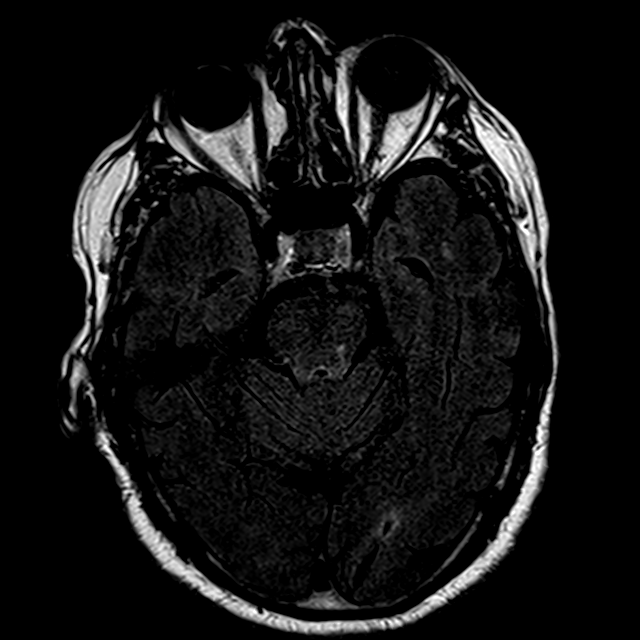
[im 31/47]
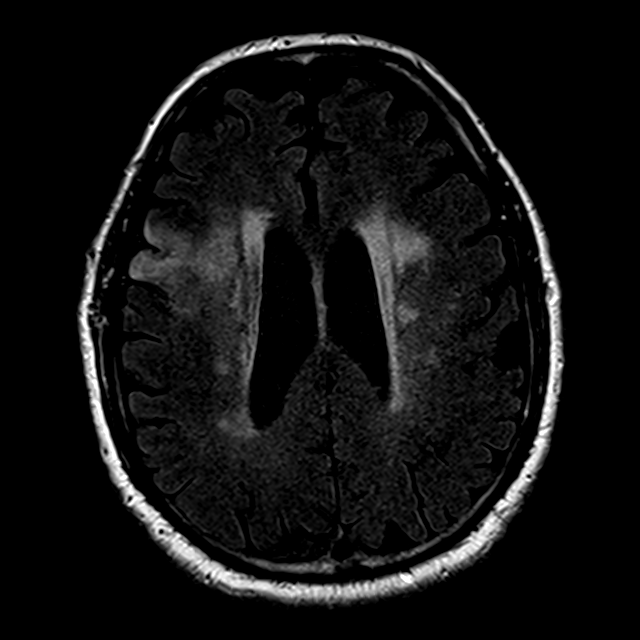
[im 47/47]
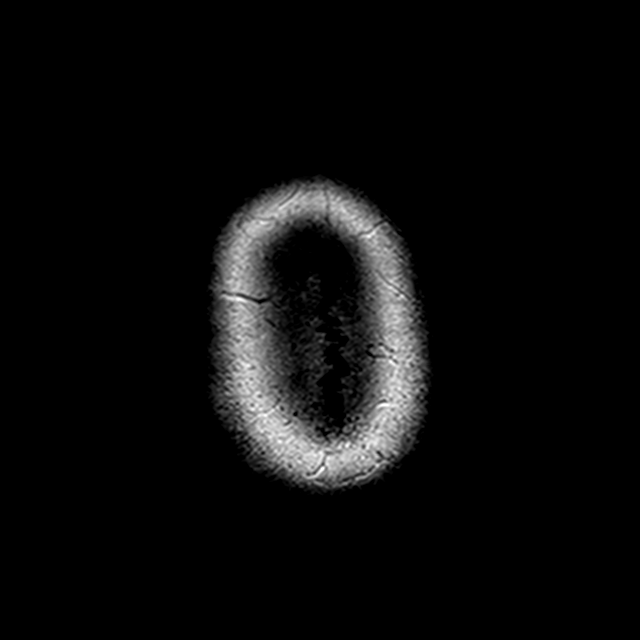

[Series 15: T1 · axial · 2.0mm · 0.43mm/px · z∈[-76,-26]mm · 3 of 76 slices shown (2 of 2)]
[im 1/76]
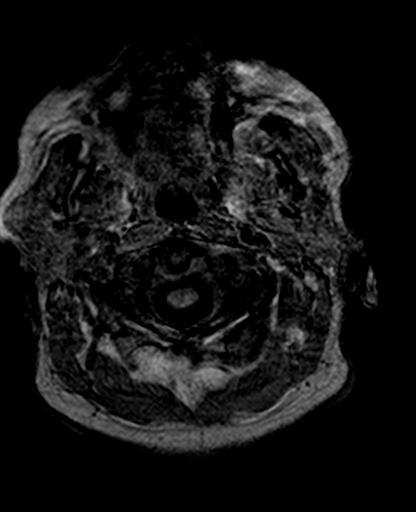
[im 13/76]
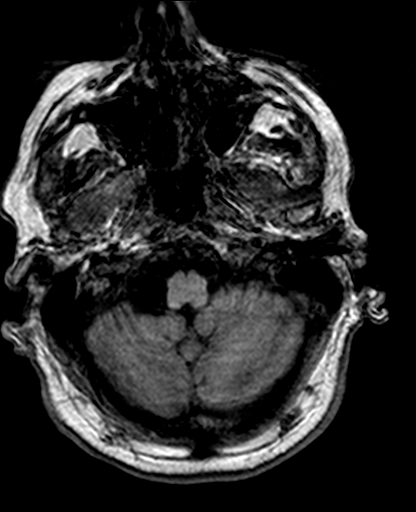
[im 26/76]
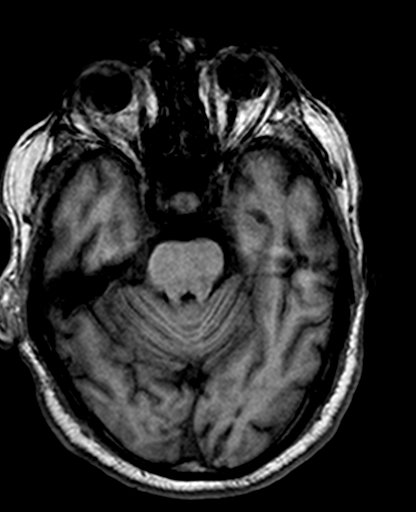

[Series 16: T2 · coronal · 5.0mm · 0.59mm/px · 3 of 28 slices shown (3 of 3)]
[im 1/28]
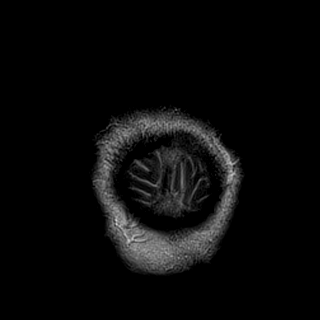
[im 14/28]
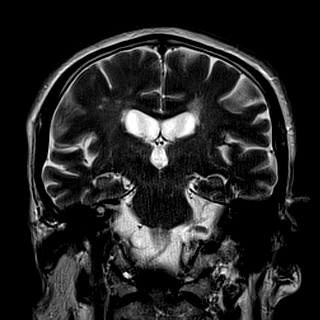
[im 28/28]
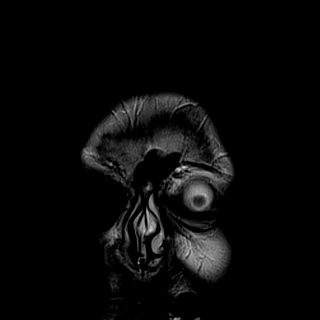

[24 of 48 positions shown; findings below may reference images not displayed]

FINDINGS: MRI HEAD FINDINGS

Brain: Moderate to low level restricted diffusion, affecting the
RIGHT frontal lobe, and insula as well as lentiform nucleus. Some
areas displaced facilitated diffusion, increased ADC, indicating
chronicity, other areas demonstrate low ADC representing acuity.
For instance, in the RIGHT inferolateral frontal lobe there are
clearly areas of encephalomalacia and gliosis. Overall the findings
are most consistent more than one episode of RIGHT MCA territory
ischemia, with features suggesting both acute/subacute as well as
chronic infarction.

Elsewhere, mild premature for age atrophy. Extensive T2 and FLAIR
hyperintensities in the white matter, consistent with chronic
microvascular ischemic change.

Vascular: Flow voids at the base the brain are preserved. There is a
tiny punctate focus of chronic hemorrhage, RIGHT posterior frontal
cortex, likely sequelae of hypertension.

Skull and upper cervical spine: Unremarkable.

Sinuses/Orbits: Chronic maxillary sinusitis. Other paranasal sinuses
are clear. Negative orbits.

Other: None.

MRA HEAD FINDINGS

The internal carotid arteries are widely patent. The basilar artery
is hypoplastic related to BILATERAL fetal PCA anatomy. Proximal
basilar is fenestrated. Both vertebrals contribute to formation of
basilar, LEFT dominant. RIGHT vertebral is diminutive following PICA
origin.

There is no flow-limiting stenosis of the anterior cerebral
arteries, ACA flow is symmetric.

No proximal stenosis of the RIGHT or LEFT middle cerebral artery.
There is a paucity of MCA branches superiorly on the RIGHT, and a
RIGHT M2 MCA stenosis or occlusion is suspected.

Posterior cerebral arteries and cerebellar branches are poorly
visualized.
IMPRESSION: Constellation of findings suggesting primarily subacute to chronic
infarction of the RIGHT frontal lobe, although foci of recent acute
ischemia are not excluded. See discussion above.

RIGHT MCA M2/M3 branches stenosis or occlusion subserving the RIGHT
frontal region, likely intracranial atherosclerotic disease.

Atrophy and small vessel disease.

MRA intracranial demonstrates hypoplasia of the vertebrobasilar
system, but no proximal large vessel occlusion.

## 2020-11-21 IMAGING — US BILATERAL CAROTID DUPLEX ULTRASOUND
1 series · 13 of 24 positions shown · non-contrast
Comparison: None.

CLINICAL DATA: Stroke. Previous tobacco abuse. Coronary artery
disease.

EXAM:
BILATERAL CAROTID DUPLEX ULTRASOUND
TECHNIQUE: Gray scale imaging, color Doppler and duplex ultrasound were
performed of bilateral carotid and vertebral arteries in the neck.

[Series 1: bilateral carotid duplex ultrasound · 13 of 69 slices shown]
[im 1/69]
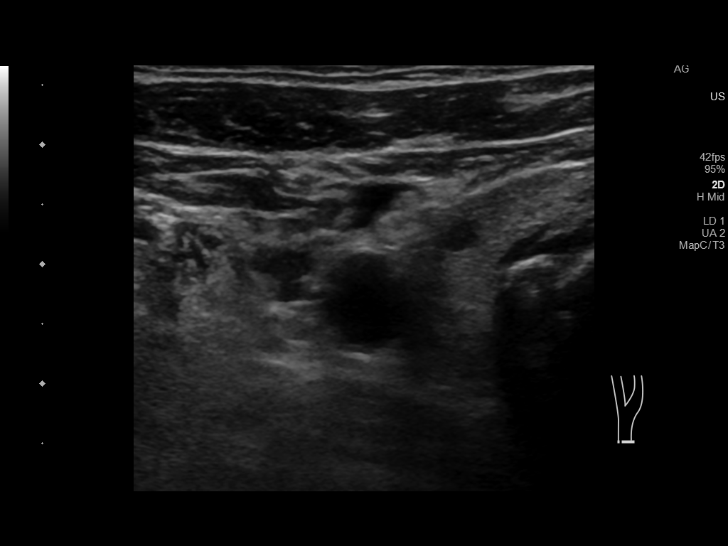
[im 6/69]
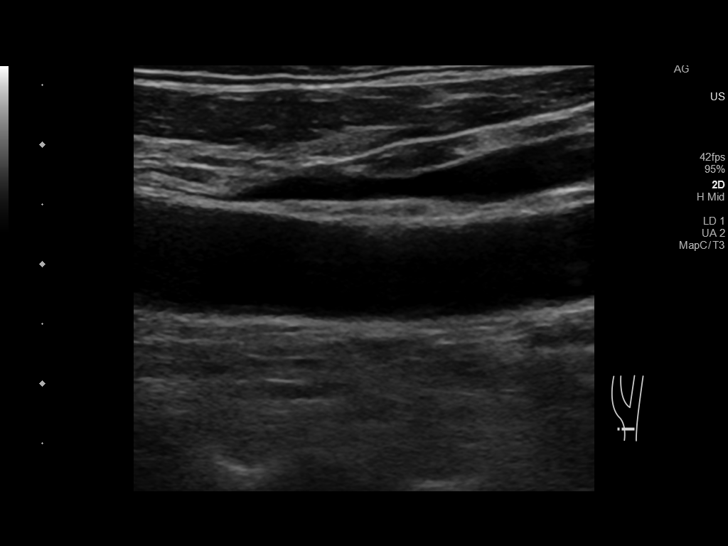
[im 12/69]
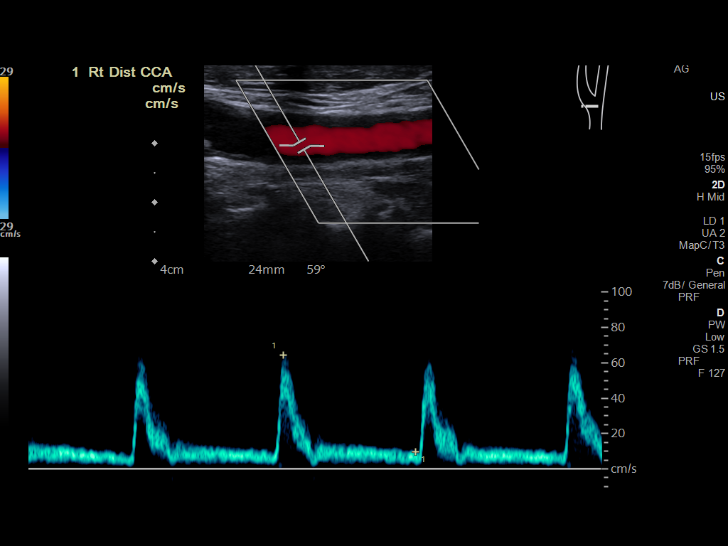
[im 18/69]
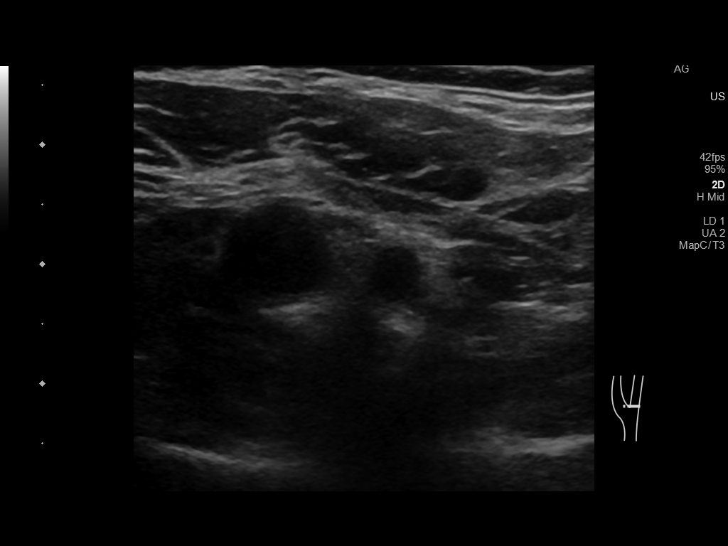
[im 24/69]
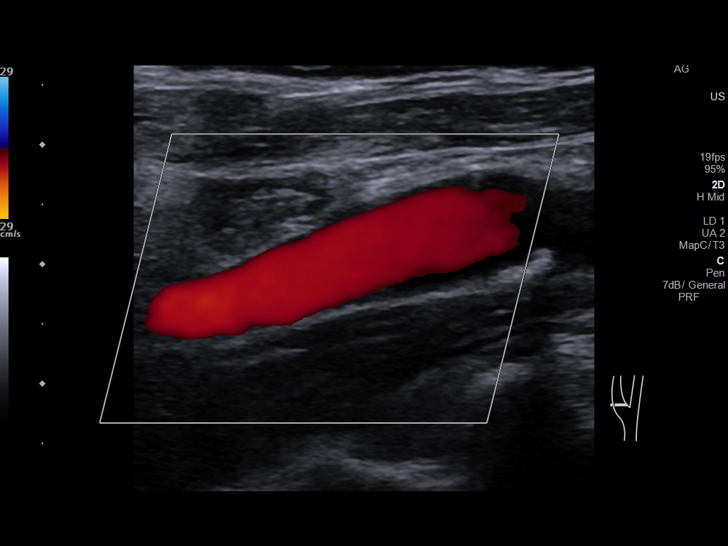
[im 30/69]
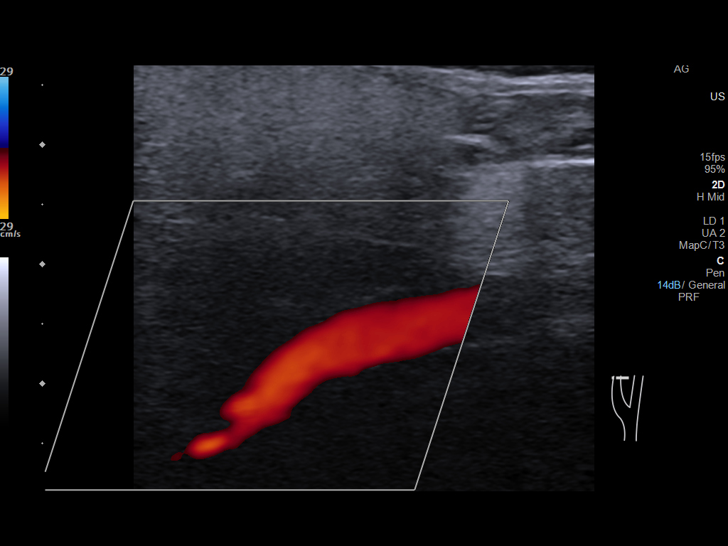
[im 36/69]
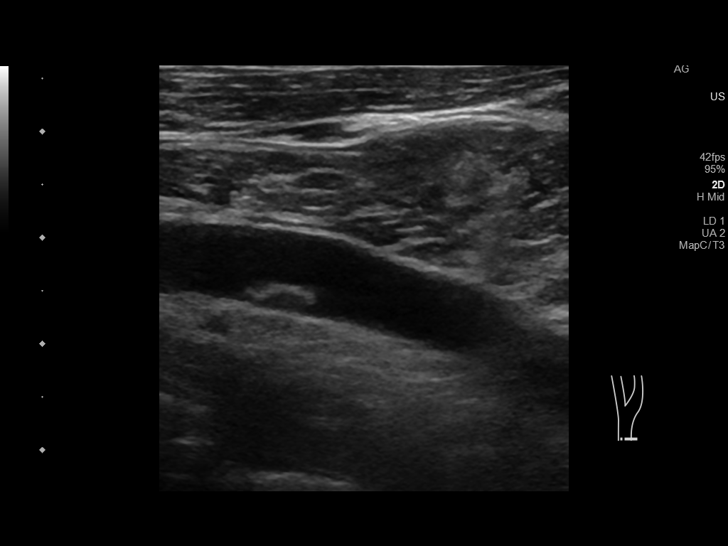
[im 39/69]
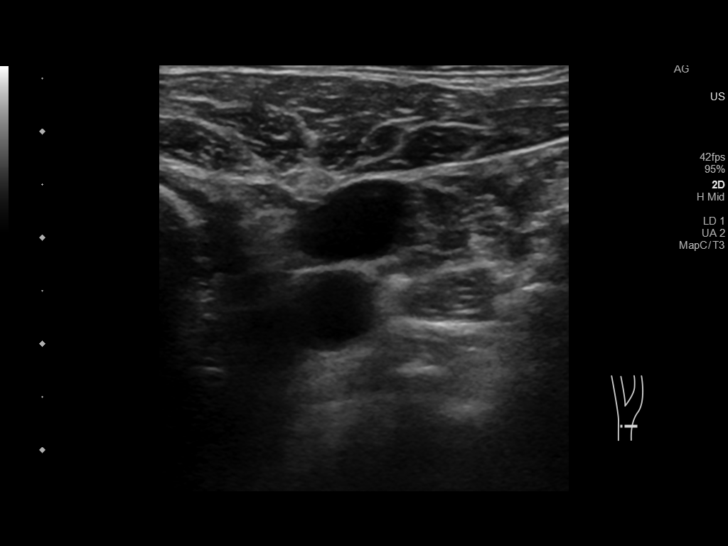
[im 45/69]
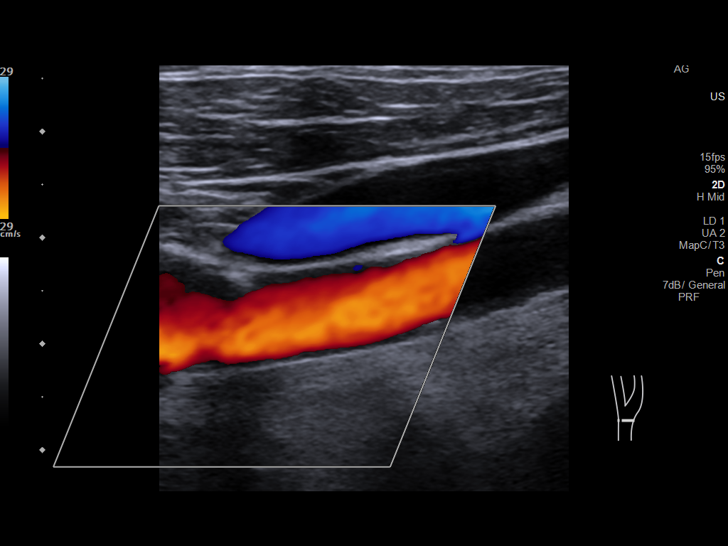
[im 51/69]
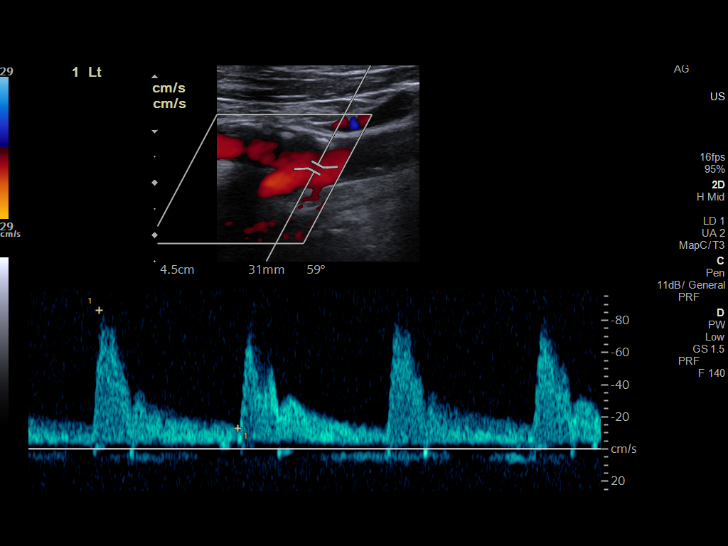
[im 57/69]
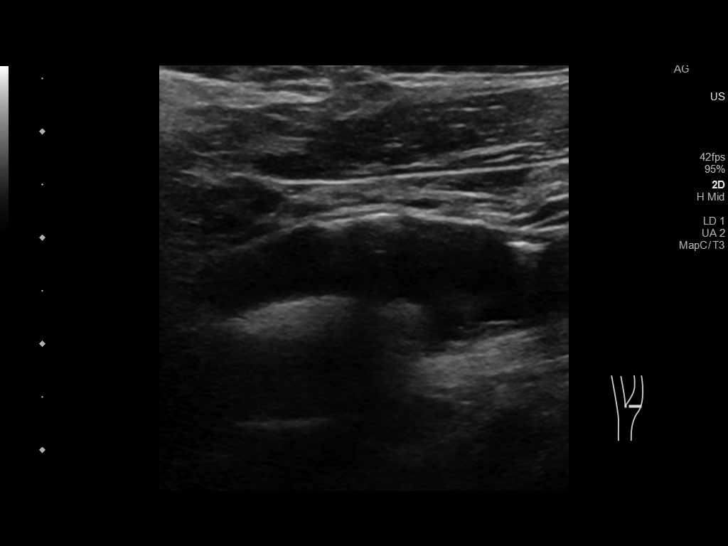
[im 63/69]
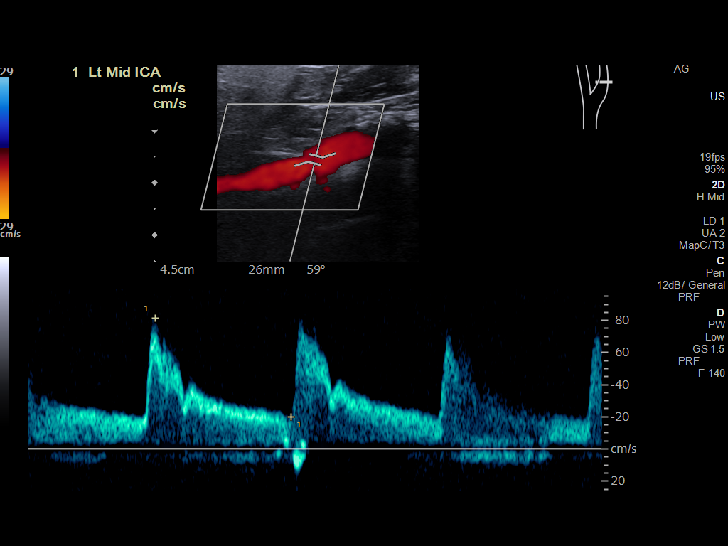
[im 69/69]
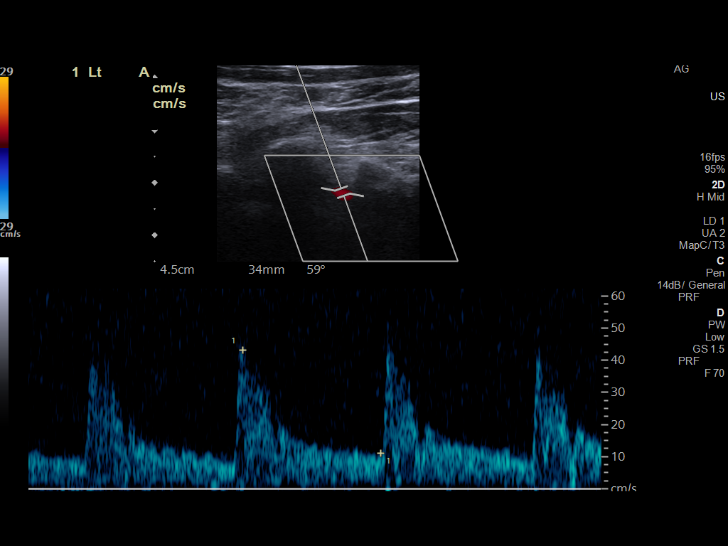

[13 of 24 positions shown; findings below may reference images not displayed]

FINDINGS: Criteria: Quantification of carotid stenosis is based on velocity
parameters that correlate the residual internal carotid diameter
with NASCET-based stenosis levels, using the diameter of the distal
internal carotid lumen as the denominator for stenosis measurement.

The following velocity measurements were obtained:

RIGHT

ICA: 83/23 cm/sec

CCA: 71/11 cm/sec

SYSTOLIC ICA/CCA RATIO:

ECA: 100 cm/sec

LEFT

ICA: 81/20 cm/sec

CCA: 91/16 cm/sec

SYSTOLIC ICA/CCA RATIO:

ECA: 78 cm/sec

RIGHT CAROTID ARTERY: Eccentric partially calcified plaque in the
bulb and ICA origin without high-grade stenosis. Normal waveforms
and color Doppler signal.

RIGHT VERTEBRAL ARTERY:  Normal flow direction and waveform.

LEFT CAROTID ARTERY: Eccentric partially calcified plaque in the
common carotid artery, bulb, and proximal ICA resulting in at least
mild stenosis. Normal waveforms and color Doppler signal.

LEFT VERTEBRAL ARTERY:  Normal flow direction and waveform.
IMPRESSION: 1. Bilateral carotid bifurcation plaque resulting in less than 50%
diameter ICA stenosis.
2. Antegrade bilateral vertebral arterial flow.

## 2020-12-09 DIAGNOSIS — Z20822 Contact with and (suspected) exposure to covid-19: Secondary | ICD-10-CM | POA: Diagnosis not present

## 2020-12-21 DIAGNOSIS — N1831 Chronic kidney disease, stage 3a: Secondary | ICD-10-CM | POA: Diagnosis not present

## 2020-12-21 DIAGNOSIS — I2510 Atherosclerotic heart disease of native coronary artery without angina pectoris: Secondary | ICD-10-CM | POA: Diagnosis not present

## 2020-12-21 DIAGNOSIS — Z79899 Other long term (current) drug therapy: Secondary | ICD-10-CM | POA: Diagnosis not present

## 2020-12-21 DIAGNOSIS — I639 Cerebral infarction, unspecified: Secondary | ICD-10-CM | POA: Diagnosis not present

## 2020-12-21 DIAGNOSIS — I480 Paroxysmal atrial fibrillation: Secondary | ICD-10-CM | POA: Diagnosis not present

## 2020-12-21 DIAGNOSIS — N400 Enlarged prostate without lower urinary tract symptoms: Secondary | ICD-10-CM | POA: Diagnosis not present

## 2020-12-21 DIAGNOSIS — E785 Hyperlipidemia, unspecified: Secondary | ICD-10-CM | POA: Diagnosis not present

## 2020-12-28 DIAGNOSIS — N1831 Chronic kidney disease, stage 3a: Secondary | ICD-10-CM | POA: Diagnosis not present

## 2020-12-28 DIAGNOSIS — I480 Paroxysmal atrial fibrillation: Secondary | ICD-10-CM | POA: Diagnosis not present

## 2021-03-04 DIAGNOSIS — Z23 Encounter for immunization: Secondary | ICD-10-CM | POA: Diagnosis not present

## 2021-03-08 ENCOUNTER — Encounter (HOSPITAL_COMMUNITY): Payer: Self-pay | Admitting: *Deleted

## 2021-03-08 ENCOUNTER — Other Ambulatory Visit: Payer: Self-pay

## 2021-03-08 ENCOUNTER — Emergency Department (HOSPITAL_COMMUNITY)
Admission: EM | Admit: 2021-03-08 | Discharge: 2021-03-08 | Disposition: A | Discharge status: Home / Self Care | Payer: Medicare Other | Attending: Emergency Medicine | Admitting: Emergency Medicine

## 2021-03-08 DIAGNOSIS — R339 Retention of urine, unspecified: Secondary | ICD-10-CM | POA: Diagnosis not present

## 2021-03-08 DIAGNOSIS — I10 Essential (primary) hypertension: Secondary | ICD-10-CM | POA: Diagnosis not present

## 2021-03-08 DIAGNOSIS — Z87891 Personal history of nicotine dependence: Secondary | ICD-10-CM | POA: Insufficient documentation

## 2021-03-08 DIAGNOSIS — I2510 Atherosclerotic heart disease of native coronary artery without angina pectoris: Secondary | ICD-10-CM | POA: Insufficient documentation

## 2021-03-08 DIAGNOSIS — Z79899 Other long term (current) drug therapy: Secondary | ICD-10-CM | POA: Diagnosis not present

## 2021-03-08 DIAGNOSIS — R338 Other retention of urine: Secondary | ICD-10-CM

## 2021-03-08 DIAGNOSIS — Z7982 Long term (current) use of aspirin: Secondary | ICD-10-CM | POA: Diagnosis not present

## 2021-03-08 LAB — URINALYSIS, ROUTINE W REFLEX MICROSCOPIC
Bacteria, UA: NONE SEEN
Bilirubin Urine: NEGATIVE
Glucose, UA: NEGATIVE mg/dL
Ketones, ur: NEGATIVE mg/dL
Leukocytes,Ua: NEGATIVE
Nitrite: NEGATIVE
Protein, ur: NEGATIVE mg/dL
Specific Gravity, Urine: 1.012 (ref 1.005–1.030)
pH: 5 (ref 5.0–8.0)

## 2021-03-08 MED ORDER — LIDOCAINE HCL URETHRAL/MUCOSAL 2 % EX GEL: 1.0000 "application " | Freq: Once | CUTANEOUS | Status: AC

## 2021-03-08 MED ORDER — HYDROCODONE-ACETAMINOPHEN 5-325 MG PO TABS
1.0000 | ORAL_TABLET | Freq: Once | ORAL | Status: AC
  Administered 2021-03-08: 1 via ORAL
  Filled 2021-03-08: qty 1

## 2021-03-08 MED ORDER — LIDOCAINE HCL URETHRAL/MUCOSAL 2 % EX GEL
CUTANEOUS | Status: AC
  Administered 2021-03-08: 1 via URETHRAL
  Filled 2021-03-08: qty 10

## 2021-03-08 NOTE — ED Provider Notes (Signed)
  Face-to-face evaluation   History: He presents for evaluation of not being able to void for about 8 hours.  Similar problem 5 years ago when he had some surgery on his prostate.  He denies nausea, vomiting, fever or chills.  He denies abdominal pain.  Physical exam: Elderly, alert cooperative.  He is tremulous.  Abdomen soft and mildly tender in suprapubic region unable to palpate bladder.  BLADDER CATHETERIZATION  Date/Time: 03/08/2021 7:22 PM Performed by: Mancel Bale, MD Authorized by: Mancel Bale, MD   Consent:    Consent obtained:  Verbal   Consent given by:  Patient   Risks discussed:  False passage, infection, urethral injury, incomplete procedure and pain   Alternatives discussed:  No treatment Universal protocol:    Patient identity confirmed:  Verbally with patient Pre-procedure details:    Procedure purpose:  Therapeutic   Preparation: Patient was prepped and draped in usual sterile fashion   Anesthesia:    Anesthesia method:  Topical application   Topical anesthetic:  Lidocaine gel Procedure details:    Provider performed due to:  Nurse unable to complete   Catheter insertion:  Indwelling   Catheter type:  Coude and Foley   Catheter size:  16 Fr   Bladder irrigation: no     Number of attempts:  1   Urine characteristics:  Clear Post-procedure details:    Procedure completion:  Tolerated well, no immediate complications Comments:     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  I personally evaluated the patient during the encounter    Mancel Bale, MD 03/09/21 1029

## 2021-03-08 NOTE — Discharge Instructions (Addendum)
You will need to leave the Foley catheter in place until you follow-up with urology.  Please call Dr. Dimas Millin office tomorrow morning to arrange a follow-up appointment.  Return to emergency department for any new or worsening symptoms.

## 2021-03-08 NOTE — ED Provider Notes (Signed)
[facility] EMERGENCY DEPARTMENT Provider Note   CSN: [id number] Arrival date & time: 03/08/21  1720     History Chief Complaint  Patient presents with   Urinary Retention    Connor [name] is a 75 y.o. male.  HPI      Connor [name] is a 75 y.o. male with past medical history of BPH, coronary artery disease, GERD, and hypertension who presents to the Emergency Department complaining of acute urinary retention since noon today.  Has history of same.  He underwent TUR prostate surgery in 2014 no complications since his procedure.  Currently he complains of pain and fullness across his lower abdomen.  He denies chest pain, shortness of breath, bowel changes or flank pain.  Nothing makes his symptoms better or worse.    Past Medical History:  Diagnosis Date   Anxiety    BPH (benign prostatic hyperplasia)    Coronary artery disease    GERD (gastroesophageal reflux disease)    High cholesterol    Hypercholesteremia    Hypertension     Patient Active Problem List   Diagnosis Date Noted   History of colonic polyps    Hx of adenomatous colonic polyps 03/30/2015   Renal cyst, left 03/30/2015    Past Surgical History:  Procedure Laterality Date   CARDIAC CATHETERIZATION     stent inserted   CARDIAC SURGERY     COLONOSCOPY  10/19/2006   [id number]:[id number] friable anal canal with normal rectum. tubular adenoma -cecum   COLONOSCOPY N/A 04/03/2015   Procedure: COLONOSCOPY;  Surgeon: Corbin Ade, MD;  [facility]: [facility];  Service: Endoscopy;  Laterality: N/A;  1245   HERNIA REPAIR Right    TRANSURETHRAL RESECTION OF PROSTATE N/A 01/29/2013   Procedure: TRANSURETHRAL RESECTION OF THE PROSTATE (TURP);  Surgeon: Ky Barban, MD;  Location: [facility] ORS;  Service: Urology;  Laterality: N/A;       Family History  Problem Relation Age of Onset   Diabetes Mother    Diabetes Father    Heart attack Father    Diabetes Sister    Diabetes Brother    Heart attack Brother     Kidney disease Sister    Diabetes Brother    Colon cancer Neg Hx    Prostate cancer Brother    Cancer Brother        ?pancreatic    Social History   Tobacco Use   Smoking status: Former    Packs/day: 2.00    Years: 40.00    Pack years: 80.00    Types: Cigarettes    Quit date: 01/24/1999    Years since quitting: 22.1   Smokeless tobacco: Never  Vaping Use   Vaping Use: Never used  Substance Use Topics   Alcohol use: Yes    Comment: occassional beer   Drug use: No    Home Medications Prior to Admission medications   Medication Sig Start Date End Date Taking? Authorizing Provider  aspirin 81 MG tablet Take 81 mg by mouth daily.    [provider]  LORazepam (ATIVAN) 1 MG tablet Take 1 mg by mouth every 8 (eight) hours.    [provider]  metoprolol (LOPRESSOR) 100 MG tablet Take 100 mg by mouth daily.    [provider]  naproxen sodium (ALEVE) 220 MG tablet Take 220 mg by mouth 2 (two) times daily as needed.    [provider]  nitroGLYCERIN (NITROSTAT) 0.4 MG SL tablet Place 0.4 mg under  the tongue every 5 (five) minutes as needed for chest pain. Last dose 6 months ago per pt 03/30/2015    [provider]  polyethylene glycol-electrolytes (TRILYTE) 420 G solution Take 4,000 mLs by mouth as directed. 03/30/15   Rourk, Gerrit Friends, MD  ranitidine (ZANTAC) 150 MG tablet Take 150 mg by mouth daily as needed for heartburn.    [provider]  simvastatin (ZOCOR) 40 MG tablet Take 40 mg by mouth at bedtime.    [provider]  trandolapril (MAVIK) 2 MG tablet Take 2 mg by mouth daily.    [provider]    Allergies    Demerol [meperidine]  Review of Systems   Review of Systems  Constitutional:  Negative for chills, fatigue and fever.  Respiratory:  Negative for shortness of breath.   Cardiovascular:  Negative for chest pain and palpitations.  Gastrointestinal:  Positive for abdominal pain. Negative for  diarrhea, nausea and vomiting.  Genitourinary:  Positive for difficulty urinating. Negative for dysuria, flank pain, hematuria, penile pain, penile swelling, scrotal swelling and testicular pain.  Musculoskeletal:  Negative for arthralgias, back pain, myalgias, neck pain and neck stiffness.  Skin:  Negative for rash.  Neurological:  Negative for dizziness, weakness, numbness and headaches.  Hematological:  Does not bruise/bleed easily.   Physical Exam Updated Vital Signs BP (!) 142/84   Pulse 86   Temp 98 F (36.7 C) (Oral)   Resp 15   Ht 5\' 11"  (1.803 m)   Wt 92.1 kg   SpO2 97%   BMI 28.31 kg/m   Physical Exam Vitals and nursing note reviewed.  Constitutional:      General: He is not in acute distress.    Appearance: Normal appearance. He is not ill-appearing or toxic-appearing.     Comments: Patient is tremulous and appears uncomfortable  Cardiovascular:     Rate and Rhythm: Normal rate and regular rhythm.     Pulses: Normal pulses.  Pulmonary:     Effort: Pulmonary effort is normal. No respiratory distress.     Breath sounds: Normal breath sounds.  Abdominal:     Palpations: Abdomen is soft.     Tenderness: There is abdominal tenderness. There is no right CVA tenderness, left CVA tenderness, guarding or rebound.     Comments: Mild distention noted of the lower abdomen.  Suprapubic tenderness noted.  Remaining abdomen soft nontender  Musculoskeletal:        General: Normal range of motion.     Right lower leg: No edema.     Left lower leg: No edema.  Skin:    General: Skin is warm.     Capillary Refill: Capillary refill takes less than 2 seconds.     Findings: No rash.  Neurological:     General: No focal deficit present.     Mental Status: He is alert.     Motor: No weakness.    ED Results / Procedures / Treatments   Labs (all labs ordered are listed, but only abnormal results are displayed) Labs Reviewed  URINALYSIS, ROUTINE W REFLEX MICROSCOPIC - Abnormal;  Notable for the following components:      Result Value   Hgb urine dipstick SMALL (*)    All other components within normal limits  URINE CULTURE    EKG None  Radiology No results found.  Procedures Procedures   Medications Ordered in ED Medications  HYDROcodone-acetaminophen (NORCO/VICODIN) 5-325 MG per tablet 1 tablet (has no administration in time range)  ED Course  I have reviewed the triage vital signs and the nursing notes.  Pertinent labs & imaging results that were available during my care of the patient were reviewed by me and considered in my medical decision making (see chart for details).    MDM Rules/Calculators/A&P                           Patient here for acute urinary retention.  Unable to void since noon.  History of BPH with TUR procedure in 2014.  Bladder scan shows 184 cc urine present.  Foley catheter ordered. Notified by nursing that cannot advance catheter, will try Coude.    Coud placed by Dr. Effie Shy.  On recheck patient has had greater than 800 cc of yellow urine output.  Patient reports feeling much better.  Lower abdomen on recheck is no longer distended and now soft.  Patient states he is now ready for discharge home.  We will switch patient to a leg bag and he is agreeable to close outpatient follow-up with urology.  Final Clinical Impression(s) / ED Diagnoses Final diagnoses:  Acute urinary retention    Rx / DC Orders ED Discharge Orders     None        Pauline Aus, PA-C 03/08/21 2345    Mancel Bale, MD 03/09/21 1029

## 2021-03-08 NOTE — ED Triage Notes (Addendum)
Pt c/o not being able to urinate since lunch today. Pt reports he feels as if his bladder is getting full. Hx urinary retention and urinary catheter insertion.

## 2021-03-08 NOTE — ED Notes (Signed)
Bladder scan done, on scanner.

## 2021-03-10 LAB — URINE CULTURE: Culture: NO GROWTH

## 2021-03-27 ENCOUNTER — Emergency Department (HOSPITAL_COMMUNITY)
Admission: EM | Admit: 2021-03-27 | Discharge: 2021-03-27 | Disposition: A | Discharge status: Home / Self Care | Payer: Medicare Other | Attending: Emergency Medicine | Admitting: Emergency Medicine

## 2021-03-27 ENCOUNTER — Encounter (HOSPITAL_COMMUNITY): Payer: Self-pay | Admitting: Emergency Medicine

## 2021-03-27 ENCOUNTER — Other Ambulatory Visit: Payer: Self-pay

## 2021-03-27 DIAGNOSIS — Z79899 Other long term (current) drug therapy: Secondary | ICD-10-CM | POA: Diagnosis not present

## 2021-03-27 DIAGNOSIS — T83031A Leakage of indwelling urethral catheter, initial encounter: Secondary | ICD-10-CM | POA: Diagnosis not present

## 2021-03-27 DIAGNOSIS — T83098A Other mechanical complication of other indwelling urethral catheter, initial encounter: Secondary | ICD-10-CM | POA: Diagnosis not present

## 2021-03-27 DIAGNOSIS — I10 Essential (primary) hypertension: Secondary | ICD-10-CM | POA: Diagnosis not present

## 2021-03-27 DIAGNOSIS — X58XXXA Exposure to other specified factors, initial encounter: Secondary | ICD-10-CM | POA: Insufficient documentation

## 2021-03-27 DIAGNOSIS — Z7982 Long term (current) use of aspirin: Secondary | ICD-10-CM | POA: Insufficient documentation

## 2021-03-27 DIAGNOSIS — I2510 Atherosclerotic heart disease of native coronary artery without angina pectoris: Secondary | ICD-10-CM | POA: Insufficient documentation

## 2021-03-27 DIAGNOSIS — T839XXA Unspecified complication of genitourinary prosthetic device, implant and graft, initial encounter: Secondary | ICD-10-CM

## 2021-03-27 DIAGNOSIS — Z87891 Personal history of nicotine dependence: Secondary | ICD-10-CM | POA: Insufficient documentation

## 2021-03-27 LAB — URINALYSIS, ROUTINE W REFLEX MICROSCOPIC
Bilirubin Urine: NEGATIVE
Glucose, UA: NEGATIVE mg/dL
Ketones, ur: NEGATIVE mg/dL
Nitrite: POSITIVE — AB
Protein, ur: 100 mg/dL — AB
Specific Gravity, Urine: 1.017 (ref 1.005–1.030)
pH: 9 — ABNORMAL HIGH (ref 5.0–8.0)

## 2021-03-27 MED ORDER — CEPHALEXIN 500 MG PO CAPS: 500.0000 mg | ORAL_CAPSULE | Freq: Four times a day (QID) | ORAL | 0 refills | Status: DC

## 2021-03-27 NOTE — ED Notes (Signed)
Tammy at bedside.  Added 3 ml to catheter bulb to total 10 ml.  Leg bag changed and urine sample sent to lab.

## 2021-03-27 NOTE — Discharge Instructions (Signed)
Take the antibiotic as directed until its finished.  Please keep your appointment with [facility].  Return to the emergency department for any new or worsening symptoms.

## 2021-03-27 NOTE — ED Provider Notes (Signed)
[facility] EMERGENCY DEPARTMENT Provider Note   CSN: [id number] Arrival date & time: 03/27/21  1540     History Chief Complaint  Patient presents with   Catheter leaking    Urine leaking around catheter for 4-7 days.  Urology appoint is Nov 23rd.     Connor [name] is a 75 y.o. male.  HPI     Connor [name] is a 75 y.o. male with past medical history of BPH, prior transurethral resection of his prostate in 2014 , coronary artery disease, and hypertension. He presents to the Emergency Department requesting evaluation of leaking from his Foley catheter.  He was seen here on 03/08/2021 and treated for acute urinary retention.  A Foley catheter was placed.  He noticed urine leaking around the insertion site of the catheter several days ago.  He states that he accidentally pulled on the tubing several weeks ago.  He denies any bleeding, pain at his penis, swelling, abdominal pain, fever, or chills.  He does endorse increased cloudiness of his urine with foul odor.  He is awaiting urology follow-up in November.   Past Medical History:  Diagnosis Date   Anxiety    BPH (benign prostatic hyperplasia)    Coronary artery disease    GERD (gastroesophageal reflux disease)    High cholesterol    Hypercholesteremia    Hypertension     Patient Active Problem List   Diagnosis Date Noted   History of colonic polyps    Hx of adenomatous colonic polyps 03/30/2015   Renal cyst, left 03/30/2015    Past Surgical History:  Procedure Laterality Date   CARDIAC CATHETERIZATION     stent inserted   CARDIAC SURGERY     COLONOSCOPY  10/19/2006   [id number] friable anal canal with normal rectum. tubular adenoma -cecum   COLONOSCOPY N/A 04/03/2015   Procedure: COLONOSCOPY;  Surgeon: Corbin Ade, MD;  [facility];  Service: Endoscopy;  Laterality: N/A;  1245   HERNIA REPAIR Right    TRANSURETHRAL RESECTION OF PROSTATE N/A 01/29/2013   Procedure: TRANSURETHRAL RESECTION OF THE  PROSTATE (TURP);  Surgeon: Ky Barban, MD;  Location: [facility] OR[facility];  Service: Urology;  Laterality: N/A;       Family History  Problem Relation Age of Onset   Diabetes Mother    Diabetes Father    Heart attack Father    Diabetes Sister    Diabetes Brother    Heart attack Brother    Kidney disease Sister    Diabetes Brother    Colon cancer Neg Hx    Prostate cancer Brother    Cancer Brother        ?pancreatic    Social History   Tobacco Use   Smoking status: Former    Packs/day: 2.00    Years: 40.00    Pack years: 80.00    Types: Cigarettes    Quit date: 01/24/1999    Years since quitting: 22.1   Smokeless tobacco: Never  Vaping Use   Vaping Use: Never used  Substance Use Topics   Alcohol use: Yes    Comment: occassional beer   Drug use: No    Home Medications Prior to Admission medications   Medication Sig Start Date End Date Taking? Authorizing Provider  acetaminophen (TYLENOL) 500 MG tablet Take 1,000 mg by mouth every 6 (six) hours as needed.    [provider]  aspirin 81 MG EC tablet Take 81 mg by mouth daily. Patient not  taking: Reported on 03/08/2021 05/15/12   [provider]  aspirin 81 MG tablet Take 81 mg by mouth daily. Patient not taking: Reported on 03/08/2021    [provider]  LORazepam (ATIVAN) 1 MG tablet Take 1 mg by mouth every 8 (eight) hours.    [provider]  metoprolol (LOPRESSOR) 100 MG tablet Take 100 mg by mouth daily.    [provider]  naproxen sodium (ALEVE) 220 MG tablet Take 220 mg by mouth 2 (two) times daily as needed. Patient not taking: Reported on 03/08/2021    [provider]  nitroGLYCERIN (NITROSTAT) 0.4 MG SL tablet Place 0.4 mg under the tongue every 5 (five) minutes as needed for chest pain. Last dose 6 months ago per pt 03/30/2015    [provider]  polyethylene glycol-electrolytes (TRILYTE) 420 G solution Take 4,000 mLs by mouth as directed. Patient not  taking: Reported on 03/08/2021 03/30/15   Corbin Ade, MD  ranitidine (ZANTAC) 150 MG tablet Take 150 mg by mouth daily as needed for heartburn.    [provider]  simvastatin (ZOCOR) 40 MG tablet Take 40 mg by mouth daily.    [provider]  trandolapril (MAVIK) 2 MG tablet Take 2 mg by mouth daily.    [provider]    Allergies    Demerol [meperidine]  Review of Systems   Review of Systems  Constitutional:  Negative for chills, fatigue and fever.  Respiratory:  Negative for shortness of breath.   Cardiovascular:  Negative for chest pain.  Gastrointestinal:  Negative for abdominal pain, blood in stool, nausea and vomiting.  Genitourinary:  Negative for decreased urine volume, dysuria, flank pain, hematuria, penile swelling, scrotal swelling and testicular pain.       Leaking urine from Foley catheter  Musculoskeletal:  Negative for arthralgias, back pain, myalgias, neck pain and neck stiffness.  Skin:  Negative for rash.  Neurological:  Negative for dizziness, weakness and numbness.  Hematological:  Does not bruise/bleed easily.   Physical Exam Updated Vital Signs BP 118/82 (BP Location: Right Arm)   Pulse 94   Temp 98.4 F (36.9 C) (Oral)   Resp 16   Ht 5\' 10"  (1.778 m)   Wt 87.5 kg   SpO2 96%   BMI 27.69 kg/m   Physical Exam Vitals and nursing note reviewed. Exam conducted with a chaperone present.  Constitutional:      General: He is not in acute distress.    Appearance: Normal appearance. He is not ill-appearing or toxic-appearing.  Cardiovascular:     Rate and Rhythm: Normal rate and regular rhythm.     Pulses: Normal pulses.  Pulmonary:     Effort: Pulmonary effort is normal.     Breath sounds: Normal breath sounds. No wheezing.  Abdominal:     General: There is no distension.     Palpations: Abdomen is soft.     Tenderness: There is no abdominal tenderness.  Genitourinary:    Penis: Normal.      Testes: Normal.      Comments: Indwelling Foley catheter in place.  Small amount of urine near the meatus.  No edema or erythema of the penis yellow urine without blood clots present in the Foley catheter bag.  A small amount of sediment is also noted. Musculoskeletal:        General: Normal range of motion.     Right lower leg: No edema.     Left lower leg: No  edema.  Skin:    General: Skin is warm.     Capillary Refill: Capillary refill takes less than 2 seconds.     Findings: No erythema or rash.  Neurological:     General: No focal deficit present.     Mental Status: He is alert.     Sensory: No sensory deficit.     Motor: No weakness.    ED Results / Procedures / Treatments   Labs (all labs ordered are listed, but only abnormal results are displayed) Labs Reviewed  URINALYSIS, ROUTINE W REFLEX MICROSCOPIC - Abnormal; Notable for the following components:      Result Value   Color, Urine AMBER (*)    APPearance CLOUDY (*)    pH 9.0 (*)    Hgb urine dipstick SMALL (*)    Protein, ur 100 (*)    Nitrite POSITIVE (*)    Leukocytes,Ua LARGE (*)    Bacteria, UA RARE (*)    All other components within normal limits  URINE CULTURE    EKG None  Radiology No results found.  Procedures Procedures   Medications Ordered in ED Medications - No data to display  ED Course  I have reviewed the triage vital signs and the nursing notes.  Pertinent labs & imaging results that were available during my care of the patient were reviewed by me and considered in my medical decision making (see chart for details).    MDM Rules/Calculators/A&P                           Patient here for evaluation of leaking from his Foley catheter.  He states he accidentally pulled on the catheter couple weeks ago.  Has noticed small amount of urine leaking from insertion site for several days.  He denies any fever, chills, abdominal pain, hematuria or nausea vomiting.  On exam, small amount of urine leaking at the  meatus.  Abdomen is soft and nontender.  No erythema or edema of the penis.  Foley catheter was irrigated without evidence of blood clots or further urinary sediment.  Patient well-appearing.  Vital signs are reviewed reassuring.  Bladder scan reveal 117 cc post residual void.  Will try irrigating foley again.   Pt had significant amt of urine flow from meatus and into foley bag per urinating.  No resistance with irrigation.  Pt denies any abdominal discomfort at this time.  He has been observed without further cath complications.  U/A nitrite positive.  Urine culture pending.  Rx for Keflex given and pt agreeable to close urology f/u.     Final Clinical Impression(s) / ED Diagnoses Final diagnoses:  Problem with Foley catheter, initial encounter [facility])    Rx / DC Orders ED Discharge Orders     None        Pauline Aus, PA-C 03/27/21 1329    Cathren Laine, MD 03/27/21 [phone]

## 2021-03-31 LAB — URINE CULTURE: Culture: >=100000 — AB

## 2021-04-01 ENCOUNTER — Telehealth: Payer: Self-pay

## 2021-04-01 NOTE — Telephone Encounter (Signed)
Post ED Visit - Positive Culture Follow-up  Culture report reviewed by antimicrobial stewardship pharmacist: [facility] [x]  , Pharm.D. []  Anson Crofts, Pharm.D., BCPS AQ-ID []  , Pharm.D., BCPS []  Celedonio Miyamoto, .D., BCPS []  [place], .D., BCPS, AAHIVP []  Georgina Pillion, Pharm.D., BCPS, AAHIVP []  [address], PharmD, BCPS []  , PharmD, BCPS []  [place], PharmD, BCPS []  [place], PharmD []  , PharmD, BCPS []  Estella Husk, PharmD  Pharmacy Team []  Lysle Pearl, PharmD []  , PharmD []  Phillips Climes, PharmD []  , Rph []  [employer]) , PharmD []  Verlan Friends, PharmD []  , PharmD []  Mervyn Gay, PharmD []  , PharmD []  Vinnie Level, PharmD []  [facility], PharmD []  , PharmD []  Len Childs, PharmD   Positive urine culture Treated with Cephalexin, organism sensitive to the same and no further patient follow-up is required at this time.  04/01/2021, 11:18 AM

## 2021-04-06 ENCOUNTER — Ambulatory Visit (INDEPENDENT_AMBULATORY_CARE_PROVIDER_SITE_OTHER): Payer: Medicare Other | Admitting: Urology

## 2021-04-06 ENCOUNTER — Encounter: Payer: Self-pay | Admitting: Urology

## 2021-04-06 ENCOUNTER — Other Ambulatory Visit: Payer: Self-pay

## 2021-04-06 VITALS — BP 114/77 | HR 91 | Temp 98.0°F

## 2021-04-06 DIAGNOSIS — N401 Enlarged prostate with lower urinary tract symptoms: Secondary | ICD-10-CM | POA: Diagnosis not present

## 2021-04-06 DIAGNOSIS — Z87440 Personal history of urinary (tract) infections: Secondary | ICD-10-CM | POA: Diagnosis not present

## 2021-04-06 DIAGNOSIS — R339 Retention of urine, unspecified: Secondary | ICD-10-CM | POA: Diagnosis not present

## 2021-04-06 DIAGNOSIS — N138 Other obstructive and reflux uropathy: Secondary | ICD-10-CM | POA: Insufficient documentation

## 2021-04-06 MED ORDER — TAMSULOSIN HCL 0.4 MG PO CAPS: 0.4000 mg | ORAL_CAPSULE | Freq: Every day | ORAL | 11 refills | Status: AC

## 2021-04-06 MED ORDER — SULFAMETHOXAZOLE-TRIMETHOPRIM 800-160 MG PO TABS: 1.0000 | ORAL_TABLET | Freq: Two times a day (BID) | ORAL | 0 refills | Status: DC

## 2021-04-06 NOTE — Progress Notes (Signed)
Assessment: 1. Urinary retention   2. BPH with obstruction/lower urinary tract symptoms     Plan: Foley removed today. Bactrim DS twice daily x7 days. Begin tamsulosin 0.4 mg daily. Return to office in 7-10 days for bladder scan. Patient advised to return to the office later today if he is unable to void.  Chief Complaint:  Chief Complaint  Patient presents with   Urinary Retention     History of Present Illness:  Connor [name] is a 75 y.o. year old male who is seen in consultation from Asencion Noble, MD for evaluation of urinary retention he presented to the emergency room on 03/08/2021 unable to void.  He had acute onset of symptoms.  No dysuria or gross hematuria.  A Foley catheter was placed with minimal difficulty.  Urine culture showed no growth. He presented to the emergency room again on 03/27/2021 with leakage around the catheter.  He was diagnosed with a UTI and started on antibiotics (Keflex).  His catheter was irrigated.  Urine culture grew >100 K Staphylococcus and stenotrophomonas.   He has a prior history of TURP in 2014.  He reports no problems voiding following his surgery.  He has not been on any alpha-blocker therapy recently.  His Foley is draining well.  No gross hematuria.  He is having some occasional leakage around the catheter.  Past Medical History:  Past Medical History:  Diagnosis Date   Anxiety    BPH (benign prostatic hyperplasia)    Coronary artery disease    GERD (gastroesophageal reflux disease)    High cholesterol    Hypercholesteremia    Hypertension     Past Surgical History:  Past Surgical History:  Procedure Laterality Date   CARDIAC CATHETERIZATION     stent inserted   CARDIAC SURGERY     COLONOSCOPY  10/19/2006   [id number] friable anal canal with normal rectum. tubular adenoma -cecum   COLONOSCOPY N/A 04/03/2015   Procedure: COLONOSCOPY;  Surgeon: Daneil Dolin, MD;  [facility];  Service: Endoscopy;   Laterality: N/A;  [place] Right    TRANSURETHRAL RESECTION OF PROSTATE N/A 01/29/2013   Procedure: TRANSURETHRAL RESECTION OF THE PROSTATE (TURP);  Surgeon: Marissa Nestle, MD;  Location: [facility];  Service: Urology;  Laterality: N/A;    Allergies:  Allergies  Allergen Reactions   Demerol [Meperidine] Diarrhea and Nausea And Vomiting    Family History:  Family History  Problem Relation Age of Onset   Diabetes Mother    Diabetes Father    Heart attack Father    Diabetes Sister    Diabetes Brother    Heart attack Brother    Kidney disease Sister    Diabetes Brother    Colon cancer Neg Hx    Prostate cancer Brother    Cancer Brother        ?pancreatic    Social History:  Social History   Tobacco Use   Smoking status: Former    Packs/day: 2.00    Years: 40.00    Pack years: 80.00    Types: Cigarettes    Quit date: 01/24/1999    Years since quitting: 22.2   Smokeless tobacco: Never  Vaping Use   Vaping Use: Never used  Substance Use Topics   Alcohol use: Yes    Comment: occassional beer   Drug use: No    Review of symptoms:  Constitutional:  Negative for unexplained weight loss, night sweats, fever, chills ENT:  Negative for nose bleeds, sinus pain, painful swallowing CV:  Negative for chest pain, shortness of breath, exercise intolerance, palpitations, loss of consciousness Resp:  Negative for cough, wheezing, shortness of breath GI:  Negative for nausea, vomiting, diarrhea, bloody stools GU:  Positives noted in HPI; otherwise negative for gross hematuria, dysuria, urinary incontinence Neuro:  Negative for seizures, poor balance, limb weakness, slurred speech Psych:  Negative for lack of energy, depression, anxiety Endocrine:  Negative for polydipsia, polyuria, symptoms of hypoglycemia (dizziness, hunger, sweating) Hematologic:  Negative for anemia, purpura, petechia, prolonged or excessive bleeding, use of anticoagulants  Allergic:  Negative for  difficulty breathing or choking as a result of exposure to anything; no shellfish allergy; no allergic response (rash/itch) to materials, foods  Physical exam: BP 114/77   Pulse 91   Temp 98 F (36.7 C)  GENERAL APPEARANCE:  Well appearing, well developed, well nourished, NAD HEENT: Atraumatic, Normocephalic, oropharynx clear. NECK: Supple without lymphadenopathy or thyromegaly. LUNGS: Clear to auscultation bilaterally. HEART: Regular Rate and Rhythm without murmurs, gallops, or rubs. ABDOMEN: Soft, non-tender, No Masses. EXTREMITIES: Moves all extremities well.  Without clubbing, cyanosis, or edema. NEUROLOGIC:  Alert and oriented x 3, normal gait, CN II-XII grossly intact.  MENTAL STATUS:  Appropriate. BACK:  Non-tender to palpation.  No CVAT SKIN:  Warm, dry and intact.   GU: Penis: Uncircumcised Meatus: Foley in place draining clear urine Scrotum: normal, no masses Testis: normal without masses bilateral   Results: No specimen obtained from foley.

## 2021-04-06 NOTE — Progress Notes (Signed)
Fill and Pull Catheter Removal  Patient is present today for a catheter removal.  Patient was cleaned and prepped in a sterile fashion 70ml of sterile water/ saline was instilled into the bladder when the patient began to have bladder spasms. 60+ mls of sterile water mixed with urine came back up into syringe. Per Dr. Pete Glatter foley was removed.  96ml of water was then drained from the balloon.  A 16 coude FR foley cath was removed from the bladder no complications were noted .  Patient tolerated well.  Performed by: Amilia Vandenbrink LPN  Follow up/ Additional notes: Per MD note

## 2021-04-06 NOTE — Progress Notes (Signed)
Urological Symptom Review  Patient is experiencing the following symptoms: Frequent urination Burning/pain with urination Get up at night to urinate Stream starts and stops Urinary tract infection   Review of Systems  Gastrointestinal (upper)  : Indigestion/heartburn  Gastrointestinal (lower) : Diarrhea Constipation  Constitutional : Negative for symptoms  Skin: Itching  Eyes: Negative for eye symptoms  Ear/Nose/Throat : Sinus problems  Hematologic/Lymphatic: Easy bruising  Cardiovascular : Negative for cardiovascular symptoms  Respiratory : Negative for respiratory symptoms  Endocrine: Negative for endocrine symptoms  Musculoskeletal: Back pain Joint pain  Neurological: Negative for neurological symptoms  Psychologic: Anxiety

## 2021-04-14 ENCOUNTER — Ambulatory Visit (INDEPENDENT_AMBULATORY_CARE_PROVIDER_SITE_OTHER): Payer: Medicare Other | Admitting: Urology

## 2021-04-14 ENCOUNTER — Other Ambulatory Visit: Payer: Self-pay

## 2021-04-14 ENCOUNTER — Encounter: Payer: Self-pay | Admitting: Urology

## 2021-04-14 VITALS — BP 112/72 | HR 83 | Temp 98.0°F

## 2021-04-14 DIAGNOSIS — N138 Other obstructive and reflux uropathy: Secondary | ICD-10-CM

## 2021-04-14 DIAGNOSIS — Z87440 Personal history of urinary (tract) infections: Secondary | ICD-10-CM

## 2021-04-14 DIAGNOSIS — R339 Retention of urine, unspecified: Secondary | ICD-10-CM | POA: Diagnosis not present

## 2021-04-14 DIAGNOSIS — Z87898 Personal history of other specified conditions: Secondary | ICD-10-CM

## 2021-04-14 DIAGNOSIS — N401 Enlarged prostate with lower urinary tract symptoms: Secondary | ICD-10-CM | POA: Diagnosis not present

## 2021-04-14 LAB — URINALYSIS, ROUTINE W REFLEX MICROSCOPIC
Bilirubin, UA: NEGATIVE
Glucose, UA: NEGATIVE
Ketones, UA: NEGATIVE
Leukocytes,UA: NEGATIVE
Nitrite, UA: NEGATIVE
Protein,UA: NEGATIVE
RBC, UA: NEGATIVE
Specific Gravity, UA: 1.025 (ref 1.005–1.030)
Urobilinogen, Ur: 0.2 mg/dL (ref 0.2–1.0)
pH, UA: 5.5 (ref 5.0–7.5)

## 2021-04-14 NOTE — Progress Notes (Signed)
Assessment: 1. BPH with obstruction/lower urinary tract symptoms   2. History of urinary retention   3. History of UTI     Plan: Continue tamsulosin Return to office in 3 months  Chief Complaint: Chief Complaint  Patient presents with   Benign Prostatic Hypertrophy     HPI: Connor [name] is a 75 y.o. male who presents for continued evaluation of urinary retention he presented to the emergency room on 03/08/2021 unable to void.  He had acute onset of symptoms.  No dysuria or gross hematuria.  A Foley catheter was placed with minimal difficulty.  Urine culture showed no growth. He presented to the emergency room again on 03/27/2021 with leakage around the catheter.  He was diagnosed with a UTI and started on antibiotics (Keflex).  His catheter was irrigated.  Urine culture grew >100 K Staphylococcus and stenotrophomonas.    He has a prior history of TURP in 2014.  He reports no problems voiding following his surgery.  He had not been on any alpha-blocker therapy recently.   His Foley was removed on 04/06/2021. He was started on tamsulosin.  He also received a 7-day course of Bactrim DS.  He returns today for follow-up.  He has been voiding spontaneously.  He feels like he is emptying his bladder.  No dysuria or gross hematuria. AUA score = 5 today.  Portions of the above documentation were copied from a prior visit for review purposes only.  Allergies: Allergies  Allergen Reactions   Demerol [Meperidine] Diarrhea and Nausea And Vomiting    PMH: Past Medical History:  Diagnosis Date   Anxiety    BPH (benign prostatic hyperplasia)    Coronary artery disease    GERD (gastroesophageal reflux disease)    High cholesterol    Hypercholesteremia    Hypertension     PSH: Past Surgical History:  Procedure Laterality Date   CARDIAC CATHETERIZATION     stent inserted   CARDIAC SURGERY     COLONOSCOPY  10/19/2006   [id number] friable anal canal with normal rectum.  tubular adenoma -cecum   COLONOSCOPY N/A 04/03/2015   Procedure: COLONOSCOPY;  Surgeon: Daneil Dolin, MD;  [facility];  Service: Endoscopy;  Laterality: N/A;  [place] Right    TRANSURETHRAL RESECTION OF PROSTATE N/A 01/29/2013   Procedure: TRANSURETHRAL RESECTION OF THE PROSTATE (TURP);  Surgeon: Marissa Nestle, MD;  Location: [facility];  Service: Urology;  Laterality: N/A;    SH: Social History   Tobacco Use   Smoking status: Former    Packs/day: 2.00    Years: 40.00    Pack years: 80.00    Types: Cigarettes    Quit date: 01/24/1999    Years since quitting: 22.2   Smokeless tobacco: Never  Vaping Use   Vaping Use: Never used  Substance Use Topics   Alcohol use: Yes    Comment: occassional beer   Drug use: No    ROS: Constitutional:  Negative for fever, chills, weight loss CV: Negative for chest pain, previous MI, hypertension Respiratory:  Negative for shortness of breath, wheezing, sleep apnea, frequent cough GI:  Negative for nausea, vomiting, bloody stool, GERD  PE: BP 112/72   Pulse 83   Temp 98 F (36.7 C)  GENERAL APPEARANCE:  Well appearing, well developed, well nourished, NAD HEENT:  Atraumatic, normocephalic, oropharynx clear NECK:  Supple without lymphadenopathy or thyromegaly ABDOMEN:  Soft, non-tender, no masses EXTREMITIES:  Moves all extremities well,  without clubbing, cyanosis, or edema NEUROLOGIC:  Alert and oriented x 3, normal gait, CN II-XII grossly intact MENTAL STATUS:  appropriate BACK:  Non-tender to palpation, No CVAT SKIN:  Warm, dry, and intact   Results: U/A:  dipstick negative  PVR:  0 ml

## 2021-04-14 NOTE — Progress Notes (Signed)
post void residual = 0 ml

## 2021-04-14 NOTE — Progress Notes (Signed)
Urological Symptom Review  Patient is experiencing the following symptoms: NONE   Review of Systems  Gastrointestinal (upper)  : Indigestion/heartburn  Gastrointestinal (lower) : Negative for lower GI symptoms  Constitutional : Negative for symptoms  Skin: Negative for skin symptoms  Eyes: Negative for eye symptoms  Ear/Nose/Throat : Negative for Ear/Nose/Throat symptoms  Hematologic/Lymphatic: Negative for Hematologic/Lymphatic symptoms  Cardiovascular : Negative for cardiovascular symptoms  Respiratory : Negative for respiratory symptoms  Endocrine: Negative for endocrine symptoms  Musculoskeletal: Negative for musculoskeletal symptoms  Neurological: Negative for neurological symptoms  Psychologic: Negative for psychiatric symptoms

## 2021-04-26 DIAGNOSIS — N1831 Chronic kidney disease, stage 3a: Secondary | ICD-10-CM | POA: Diagnosis not present

## 2021-04-26 DIAGNOSIS — I10 Essential (primary) hypertension: Secondary | ICD-10-CM | POA: Diagnosis not present

## 2021-04-26 DIAGNOSIS — I2510 Atherosclerotic heart disease of native coronary artery without angina pectoris: Secondary | ICD-10-CM | POA: Diagnosis not present

## 2021-04-26 DIAGNOSIS — I480 Paroxysmal atrial fibrillation: Secondary | ICD-10-CM | POA: Diagnosis not present

## 2021-04-26 DIAGNOSIS — Z79899 Other long term (current) drug therapy: Secondary | ICD-10-CM | POA: Diagnosis not present

## 2021-04-28 ENCOUNTER — Ambulatory Visit: Payer: Medicare Other | Admitting: Urology

## 2021-05-03 DIAGNOSIS — D6869 Other thrombophilia: Secondary | ICD-10-CM | POA: Diagnosis not present

## 2021-05-03 DIAGNOSIS — I10 Essential (primary) hypertension: Secondary | ICD-10-CM | POA: Diagnosis not present

## 2021-05-03 DIAGNOSIS — N1831 Chronic kidney disease, stage 3a: Secondary | ICD-10-CM | POA: Diagnosis not present

## 2021-05-03 DIAGNOSIS — I480 Paroxysmal atrial fibrillation: Secondary | ICD-10-CM | POA: Diagnosis not present

## 2021-05-03 DIAGNOSIS — E785 Hyperlipidemia, unspecified: Secondary | ICD-10-CM | POA: Diagnosis not present

## 2021-05-05 DIAGNOSIS — Z23 Encounter for immunization: Secondary | ICD-10-CM | POA: Diagnosis not present

## 2021-05-11 DIAGNOSIS — Z20822 Contact with and (suspected) exposure to covid-19: Secondary | ICD-10-CM | POA: Diagnosis not present

## 2021-08-03 DIAGNOSIS — I2510 Atherosclerotic heart disease of native coronary artery without angina pectoris: Secondary | ICD-10-CM | POA: Diagnosis not present

## 2021-08-03 DIAGNOSIS — E785 Hyperlipidemia, unspecified: Secondary | ICD-10-CM | POA: Diagnosis not present

## 2021-08-16 DIAGNOSIS — Z20822 Contact with and (suspected) exposure to covid-19: Secondary | ICD-10-CM | POA: Diagnosis not present

## 2021-08-18 DIAGNOSIS — Z20822 Contact with and (suspected) exposure to covid-19: Secondary | ICD-10-CM | POA: Diagnosis not present

## 2021-08-24 DIAGNOSIS — Z79899 Other long term (current) drug therapy: Secondary | ICD-10-CM | POA: Diagnosis not present

## 2021-08-24 DIAGNOSIS — I480 Paroxysmal atrial fibrillation: Secondary | ICD-10-CM | POA: Diagnosis not present

## 2021-08-24 DIAGNOSIS — I10 Essential (primary) hypertension: Secondary | ICD-10-CM | POA: Diagnosis not present

## 2021-08-24 DIAGNOSIS — N1831 Chronic kidney disease, stage 3a: Secondary | ICD-10-CM | POA: Diagnosis not present

## 2021-08-24 DIAGNOSIS — I2510 Atherosclerotic heart disease of native coronary artery without angina pectoris: Secondary | ICD-10-CM | POA: Diagnosis not present

## 2021-08-24 DIAGNOSIS — D6869 Other thrombophilia: Secondary | ICD-10-CM | POA: Diagnosis not present

## 2021-08-31 DIAGNOSIS — D6869 Other thrombophilia: Secondary | ICD-10-CM | POA: Diagnosis not present

## 2021-08-31 DIAGNOSIS — I10 Essential (primary) hypertension: Secondary | ICD-10-CM | POA: Diagnosis not present

## 2021-08-31 DIAGNOSIS — I480 Paroxysmal atrial fibrillation: Secondary | ICD-10-CM | POA: Diagnosis not present

## 2021-09-17 DIAGNOSIS — Z20822 Contact with and (suspected) exposure to covid-19: Secondary | ICD-10-CM | POA: Diagnosis not present

## 2021-09-20 DIAGNOSIS — Z20822 Contact with and (suspected) exposure to covid-19: Secondary | ICD-10-CM | POA: Diagnosis not present

## 2021-10-04 DIAGNOSIS — Z20822 Contact with and (suspected) exposure to covid-19: Secondary | ICD-10-CM | POA: Diagnosis not present

## 2021-10-11 DIAGNOSIS — Z20822 Contact with and (suspected) exposure to covid-19: Secondary | ICD-10-CM | POA: Diagnosis not present

## 2021-12-24 DIAGNOSIS — N1831 Chronic kidney disease, stage 3a: Secondary | ICD-10-CM | POA: Diagnosis not present

## 2021-12-24 DIAGNOSIS — I2510 Atherosclerotic heart disease of native coronary artery without angina pectoris: Secondary | ICD-10-CM | POA: Diagnosis not present

## 2021-12-24 DIAGNOSIS — I480 Paroxysmal atrial fibrillation: Secondary | ICD-10-CM | POA: Diagnosis not present

## 2021-12-24 DIAGNOSIS — I10 Essential (primary) hypertension: Secondary | ICD-10-CM | POA: Diagnosis not present

## 2021-12-24 DIAGNOSIS — Z79899 Other long term (current) drug therapy: Secondary | ICD-10-CM | POA: Diagnosis not present

## 2021-12-31 DIAGNOSIS — I10 Essential (primary) hypertension: Secondary | ICD-10-CM | POA: Diagnosis not present

## 2021-12-31 DIAGNOSIS — I480 Paroxysmal atrial fibrillation: Secondary | ICD-10-CM | POA: Diagnosis not present

## 2021-12-31 DIAGNOSIS — N1832 Chronic kidney disease, stage 3b: Secondary | ICD-10-CM | POA: Diagnosis not present

## 2022-04-07 ENCOUNTER — Encounter: Payer: Self-pay | Admitting: *Deleted

## 2022-04-27 DIAGNOSIS — I10 Essential (primary) hypertension: Secondary | ICD-10-CM | POA: Diagnosis not present

## 2022-04-27 DIAGNOSIS — I2510 Atherosclerotic heart disease of native coronary artery without angina pectoris: Secondary | ICD-10-CM | POA: Diagnosis not present

## 2022-04-27 DIAGNOSIS — Z79899 Other long term (current) drug therapy: Secondary | ICD-10-CM | POA: Diagnosis not present

## 2022-04-27 DIAGNOSIS — I4821 Permanent atrial fibrillation: Secondary | ICD-10-CM | POA: Diagnosis not present

## 2022-04-27 DIAGNOSIS — N1832 Chronic kidney disease, stage 3b: Secondary | ICD-10-CM | POA: Diagnosis not present

## 2022-05-03 DIAGNOSIS — I10 Essential (primary) hypertension: Secondary | ICD-10-CM | POA: Diagnosis not present

## 2022-05-03 DIAGNOSIS — N1832 Chronic kidney disease, stage 3b: Secondary | ICD-10-CM | POA: Diagnosis not present

## 2022-05-03 DIAGNOSIS — I2510 Atherosclerotic heart disease of native coronary artery without angina pectoris: Secondary | ICD-10-CM | POA: Diagnosis not present

## 2022-05-03 DIAGNOSIS — I480 Paroxysmal atrial fibrillation: Secondary | ICD-10-CM | POA: Diagnosis not present

## 2022-05-03 DIAGNOSIS — Z23 Encounter for immunization: Secondary | ICD-10-CM | POA: Diagnosis not present

## 2022-05-03 DIAGNOSIS — E785 Hyperlipidemia, unspecified: Secondary | ICD-10-CM | POA: Diagnosis not present

## 2022-05-03 DIAGNOSIS — R7309 Other abnormal glucose: Secondary | ICD-10-CM | POA: Diagnosis not present

## 2022-05-11 DIAGNOSIS — Z23 Encounter for immunization: Secondary | ICD-10-CM | POA: Diagnosis not present

## 2022-07-08 DIAGNOSIS — Z0000 Encounter for general adult medical examination without abnormal findings: Secondary | ICD-10-CM | POA: Diagnosis not present

## 2022-08-23 DIAGNOSIS — N1832 Chronic kidney disease, stage 3b: Secondary | ICD-10-CM | POA: Diagnosis not present

## 2022-08-23 DIAGNOSIS — I10 Essential (primary) hypertension: Secondary | ICD-10-CM | POA: Diagnosis not present

## 2022-08-30 DIAGNOSIS — N1832 Chronic kidney disease, stage 3b: Secondary | ICD-10-CM | POA: Diagnosis not present

## 2022-08-30 DIAGNOSIS — I480 Paroxysmal atrial fibrillation: Secondary | ICD-10-CM | POA: Diagnosis not present

## 2022-08-30 DIAGNOSIS — I10 Essential (primary) hypertension: Secondary | ICD-10-CM | POA: Diagnosis not present

## 2022-09-19 ENCOUNTER — Encounter: Payer: Self-pay | Admitting: Urology

## 2022-09-19 ENCOUNTER — Ambulatory Visit (INDEPENDENT_AMBULATORY_CARE_PROVIDER_SITE_OTHER): Payer: Medicare Other | Admitting: Urology

## 2022-09-19 VITALS — BP 113/72 | HR 81

## 2022-09-19 DIAGNOSIS — N138 Other obstructive and reflux uropathy: Secondary | ICD-10-CM | POA: Diagnosis not present

## 2022-09-19 DIAGNOSIS — N401 Enlarged prostate with lower urinary tract symptoms: Secondary | ICD-10-CM

## 2022-09-19 DIAGNOSIS — R3912 Poor urinary stream: Secondary | ICD-10-CM | POA: Diagnosis not present

## 2022-09-19 LAB — URINALYSIS, ROUTINE W REFLEX MICROSCOPIC
Bilirubin, UA: NEGATIVE
Glucose, UA: NEGATIVE
Ketones, UA: NEGATIVE
Leukocytes,UA: NEGATIVE
Nitrite, UA: NEGATIVE
RBC, UA: NEGATIVE
Specific Gravity, UA: 1.025 (ref 1.005–1.030)
Urobilinogen, Ur: 0.2 mg/dL (ref 0.2–1.0)
pH, UA: 5 (ref 5.0–7.5)

## 2022-09-19 NOTE — Progress Notes (Signed)
09/19/2022 9:21 AM   Connor [name] 17-Oct-1945 [id number]  Referring provider: Carylon Perches, MD [address] [place],  [place] [address]  No chief complaint on file.   HPI:  Follow-up-  1) BPH-patient with a history of TURP in 2014 with Dr. Jesse Fall.  He is on tamsulosin.  Had a recent UTI.  CT in 2017 revealed a 35 g prostate with TURP defect.  AUA symptom score was 5.  Today, he is seen for the above. He voids with a good stream and feels empty. No bothersome LUTS. No dysuria or gross hematuria.   He is on eliquis.   PMH: Past Medical History:  Diagnosis Date   Anxiety    BPH (benign prostatic hyperplasia)    Coronary artery disease    GERD (gastroesophageal reflux disease)    High cholesterol    Hypercholesteremia    Hypertension     Surgical History: Past Surgical History:  Procedure Laterality Date   CARDIAC CATHETERIZATION     stent inserted   CARDIAC SURGERY     COLONOSCOPY  10/19/2006   [id number] friable anal canal with normal rectum. tubular adenoma -cecum   COLONOSCOPY N/A 04/03/2015   Procedure: COLONOSCOPY;  Surgeon: Corbin Ade, MD;  [facility];  Service: Endoscopy;  Laterality: N/A;  1245   HERNIA REPAIR Right    TRANSURETHRAL RESECTION OF PROSTATE N/A 01/29/2013   Procedure: TRANSURETHRAL RESECTION OF THE PROSTATE (TURP);  Surgeon: Ky Barban, MD;  Location: [facility];  Service: Urology;  Laterality: N/A;    Home Medications:  Allergies as of 09/19/2022       Reactions   Demerol [meperidine] Diarrhea, Nausea And Vomiting        Medication List        Accurate as of September 19, 2022  9:21 AM. If you have any questions, ask your nurse or doctor.          acetaminophen 500 MG tablet Commonly known as: TYLENOL Take 1,000 mg by mouth every 6 (six) hours as needed.   cyclobenzaprine 10 MG tablet Commonly known as: FLEXERIL Take 10 mg by mouth 2 (two) times daily as needed for muscle spasms.   Eliquis 5 MG  Tabs tablet Generic drug: apixaban Take 5 mg by mouth 2 (two) times daily.   lisinopril 20 MG tablet Commonly known as: ZESTRIL Take 20 mg by mouth daily.   LORazepam 1 MG tablet Commonly known as: ATIVAN Take 1 mg by mouth every 8 (eight) hours.   metoprolol tartrate 100 MG tablet Commonly known as: LOPRESSOR Take 100 mg by mouth daily.   nitroGLYCERIN 0.4 MG SL tablet Commonly known as: NITROSTAT Place 0.4 mg under the tongue every 5 (five) minutes as needed for chest pain. Last dose 6 months ago per pt 03/30/2015   polyethylene glycol-electrolytes 420 g solution Commonly known as: TriLyte Take 4,000 mLs by mouth as directed.   ranitidine 150 MG tablet Commonly known as: ZANTAC Take 150 mg by mouth daily as needed for heartburn.   simvastatin 40 MG tablet Commonly known as: ZOCOR Take 40 mg by mouth daily.   sulfamethoxazole-trimethoprim 800-160 MG tablet Commonly known as: BACTRIM DS Take 1 tablet by mouth every 12 (twelve) hours.   tamsulosin 0.4 MG Caps capsule Commonly known as: FLOMAX Take 1 capsule (0.4 mg total) by mouth daily.   trandolapril 2 MG tablet Commonly known as: MAVIK Take 2 mg by mouth daily.  Allergies:  Allergies  Allergen Reactions   Demerol [Meperidine] Diarrhea and Nausea And Vomiting    Family History: Family History  Problem Relation Age of Onset   Diabetes Mother    Diabetes Father    Heart attack Father    Diabetes Sister    Diabetes Brother    Heart attack Brother    Kidney disease Sister    Diabetes Brother    Colon cancer Neg Hx    Prostate cancer Brother    Cancer Brother        ?pancreatic    Social History:  reports that he quit smoking about 23 years ago. His smoking use included cigarettes. He has a 80.00 pack-year smoking history. He has never used smokeless tobacco. He reports current alcohol use. He reports that he does not use drugs.   Physical Exam: BP 113/72   Pulse 81   Constitutional:   Alert and oriented, No acute distress. HEENT: [place] AT, moist mucus membranes.  Trachea midline, no masses. Cardiovascular: No clubbing, cyanosis, or edema. Respiratory: Normal respiratory effort, no increased work of breathing. GI: Abdomen is soft, nontender, nondistended, no abdominal masses GU: No CVA tenderness Lymph: No cervical or inguinal lymphadenopathy. Skin: No rashes, bruises or suspicious lesions. Neurologic: Grossly intact, no focal deficits, moving all 4 extremities. Psychiatric: Normal mood and affect. DRE: prostate about 40 g and smooth without hard area or nodule   Laboratory Data: Lab Results  Component Value Date   WBC 10.3 01/30/2013   HGB 15.7 01/30/2013   HCT 46.0 01/30/2013   MCV 87.8 01/30/2013   PLT 179 01/30/2013    Lab Results  Component Value Date   CREATININE 1.06 01/30/2013    No results found for: "PSA"  No results found for: "TESTOSTERONE"  No results found for: "HGBA1C"  Urinalysis    Component Value Date/Time   COLORURINE AMBER (A) 03/27/2021 0930   APPEARANCEUR Clear 04/14/2021 1159   LABSPEC 1.017 03/27/2021 0930   PHURINE 9.0 (H) 03/27/2021 0930   GLUCOSEU Negative 04/14/2021 1159   HGBUR SMALL (A) 03/27/2021 0930   BILIRUBINUR Negative 04/14/2021 1159   KETONESUR NEGATIVE 03/27/2021 0930   PROTEINUR Negative 04/14/2021 1159   PROTEINUR 100 (A) 03/27/2021 0930   UROBILINOGEN 0.2 12/27/2012 1444   NITRITE Negative 04/14/2021 1159   NITRITE POSITIVE (A) 03/27/2021 0930   LEUKOCYTESUR Negative 04/14/2021 1159   LEUKOCYTESUR LARGE (A) 03/27/2021 0930    Lab Results  Component Value Date   LABMICR Comment 04/14/2021   BACTERIA RARE (A) 03/27/2021    Pertinent Imaging: CT images reviewed -   US Renal  Narrative CLINICAL DATA:  Left renal cyst followup  EXAM: RENAL / URINARY TRACT ULTRASOUND COMPLETE  COMPARISON:  Renal ultrasound 01/03/2013  FINDINGS: Right Kidney:  Length: 11.1 cm. Mild right hydronephrosis which  was not present previously. No renal mass  Left Kidney:  Length: 11.8 cm. Mild left hydronephrosis which was not present previously. Left lower pole cyst 12 x 9 mm is stable.  Bladder:  Distended urinary bladder. Soft tissue mass in the base of the bladder on the left measures 2.3 x 2.0 x 2.1 cm. This was not seen previously. Previously there was a Foley catheter in the bladder which is decompressed. Left ureteral jet identified. Right ureteral jet not visualized  IMPRESSION: Mild hydronephrosis bilaterally likely due to a full urinary bladder.  12 x 9 mm left lower pole cyst is stable  Bladder mass on the left may represent a neoplasm.  Urologic evaluation recommended.   Electronically Signed By: Marlan Palau M.D. On: 06/17/2015 09:43  No valid procedures specified. No results found for this or any previous visit.  No results found for this or any previous visit.   Assessment & Plan:    1. BPH with obstruction/lower urinary tract symptoms - continues to do well on tamsulosin. See back in 1 year or sooner of issues.   - Urinalysis, Routine w reflex microscopic   No follow-ups on file.  Jerilee Field, MD  [facility]  [address] [place], [place] [address] [phone]

## 2022-10-10 DIAGNOSIS — H25813 Combined forms of age-related cataract, bilateral: Secondary | ICD-10-CM | POA: Diagnosis not present

## 2022-12-26 DIAGNOSIS — Z79899 Other long term (current) drug therapy: Secondary | ICD-10-CM | POA: Diagnosis not present

## 2022-12-26 DIAGNOSIS — I4821 Permanent atrial fibrillation: Secondary | ICD-10-CM | POA: Diagnosis not present

## 2022-12-26 DIAGNOSIS — I2510 Atherosclerotic heart disease of native coronary artery without angina pectoris: Secondary | ICD-10-CM | POA: Diagnosis not present

## 2022-12-26 DIAGNOSIS — I10 Essential (primary) hypertension: Secondary | ICD-10-CM | POA: Diagnosis not present

## 2022-12-26 DIAGNOSIS — N1832 Chronic kidney disease, stage 3b: Secondary | ICD-10-CM | POA: Diagnosis not present

## 2023-01-02 DIAGNOSIS — N1832 Chronic kidney disease, stage 3b: Secondary | ICD-10-CM | POA: Diagnosis not present

## 2023-01-02 DIAGNOSIS — I2510 Atherosclerotic heart disease of native coronary artery without angina pectoris: Secondary | ICD-10-CM | POA: Diagnosis not present

## 2023-01-02 DIAGNOSIS — I10 Essential (primary) hypertension: Secondary | ICD-10-CM | POA: Diagnosis not present

## 2023-01-02 DIAGNOSIS — I480 Paroxysmal atrial fibrillation: Secondary | ICD-10-CM | POA: Diagnosis not present

## 2023-01-02 DIAGNOSIS — R7309 Other abnormal glucose: Secondary | ICD-10-CM | POA: Diagnosis not present

## 2023-02-21 DIAGNOSIS — Z23 Encounter for immunization: Secondary | ICD-10-CM | POA: Diagnosis not present

## 2023-05-02 DIAGNOSIS — N1831 Chronic kidney disease, stage 3a: Secondary | ICD-10-CM | POA: Diagnosis not present

## 2023-05-02 DIAGNOSIS — Z79899 Other long term (current) drug therapy: Secondary | ICD-10-CM | POA: Diagnosis not present

## 2023-05-02 DIAGNOSIS — R7303 Prediabetes: Secondary | ICD-10-CM | POA: Diagnosis not present

## 2023-05-02 DIAGNOSIS — I4821 Permanent atrial fibrillation: Secondary | ICD-10-CM | POA: Diagnosis not present

## 2023-05-02 DIAGNOSIS — I2510 Atherosclerotic heart disease of native coronary artery without angina pectoris: Secondary | ICD-10-CM | POA: Diagnosis not present

## 2023-05-09 DIAGNOSIS — I10 Essential (primary) hypertension: Secondary | ICD-10-CM | POA: Diagnosis not present

## 2023-05-09 DIAGNOSIS — E785 Hyperlipidemia, unspecified: Secondary | ICD-10-CM | POA: Diagnosis not present

## 2023-05-09 DIAGNOSIS — N1831 Chronic kidney disease, stage 3a: Secondary | ICD-10-CM | POA: Diagnosis not present

## 2023-05-09 DIAGNOSIS — I480 Paroxysmal atrial fibrillation: Secondary | ICD-10-CM | POA: Diagnosis not present

## 2023-08-30 DIAGNOSIS — I2510 Atherosclerotic heart disease of native coronary artery without angina pectoris: Secondary | ICD-10-CM | POA: Diagnosis not present

## 2023-08-30 DIAGNOSIS — I10 Essential (primary) hypertension: Secondary | ICD-10-CM | POA: Diagnosis not present

## 2023-08-30 DIAGNOSIS — Z79899 Other long term (current) drug therapy: Secondary | ICD-10-CM | POA: Diagnosis not present

## 2023-08-30 DIAGNOSIS — N1831 Chronic kidney disease, stage 3a: Secondary | ICD-10-CM | POA: Diagnosis not present

## 2023-09-06 DIAGNOSIS — N1831 Chronic kidney disease, stage 3a: Secondary | ICD-10-CM | POA: Diagnosis not present

## 2023-09-06 DIAGNOSIS — I4821 Permanent atrial fibrillation: Secondary | ICD-10-CM | POA: Diagnosis not present

## 2023-09-06 DIAGNOSIS — I10 Essential (primary) hypertension: Secondary | ICD-10-CM | POA: Diagnosis not present

## 2023-09-07 ENCOUNTER — Telehealth: Payer: Self-pay

## 2023-09-07 NOTE — Progress Notes (Signed)
   09/07/2023  Patient ID: Connor [name], male   DOB: [birth year], 78 y.o.   MRN: [id number]  Patient Assistance - Eliquis  Talked with Jerolyn Shin, he does not have Part D coverage, pays for all his medications OOP at [facility]. He likely doesn't qualify for Extra Help LIS based on the income information he provided me but he will qualify for Eliquis patient assistance. He was approved for this in 2023 and 2024 however he doesn't remember providing any OOP spending reports in the past. The program usually requires 3% OOP spend on medications before approval so he will need to keep track of how much he spends on medications throughout the year. He was provided with Eliquis samples so we have some time to get his application submitted and approved, however he will need to reach that 3% OOP spend to be approved. I filled out the application and left it at the front desk for his signature.    Fayette Pho, PharmD

## 2023-09-11 ENCOUNTER — Ambulatory Visit (INDEPENDENT_AMBULATORY_CARE_PROVIDER_SITE_OTHER): Admitting: Urology

## 2023-09-11 VITALS — BP 112/68 | HR 81

## 2023-09-11 DIAGNOSIS — R310 Gross hematuria: Secondary | ICD-10-CM | POA: Diagnosis not present

## 2023-09-11 DIAGNOSIS — N401 Enlarged prostate with lower urinary tract symptoms: Secondary | ICD-10-CM

## 2023-09-11 DIAGNOSIS — N138 Other obstructive and reflux uropathy: Secondary | ICD-10-CM | POA: Diagnosis not present

## 2023-09-11 LAB — URINALYSIS, ROUTINE W REFLEX MICROSCOPIC
Bilirubin, UA: NEGATIVE
Glucose, UA: NEGATIVE
Ketones, UA: NEGATIVE
Nitrite, UA: NEGATIVE
Specific Gravity, UA: 1.03 (ref 1.005–1.030)
Urobilinogen, Ur: 0.2 mg/dL (ref 0.2–1.0)
pH, UA: 6 (ref 5.0–7.5)

## 2023-09-11 LAB — MICROSCOPIC EXAMINATION

## 2023-09-11 NOTE — Progress Notes (Signed)
 Bladder Scan completed today.  Patient can void prior to the bladder scan. Bladder scan result: 1  Performed By: Alfonse Spruce. CMA  Additional notes-

## 2023-09-11 NOTE — Progress Notes (Signed)
 09/11/2023 11:13 AM   Connor [name] [birth year] [id number]  Referring provider: Carylon Perches, MD [address] [place],  [place] [address]  No chief complaint on file.   HPI:  Follow-up-   1) BPH-patient with a history of TURP in 2014 with Dr. Jesse Fall.  He is on tamsulosin.  Had a recent UTI.  CT in 2017 revealed a 35 g prostate with TURP defect.  AUA symptom score was 5.   Today, he is seen for the above. He voids with a good stream and feels empty. No bothersome LUTS. No dysuria. He had episode gross hematuria in Jan 2025. Red urine. No clots resolved. On tamsulosin. He is on eliquis. His Nov 2024 Cr 1.27 and gfr 57.    PMH: Past Medical History:  Diagnosis Date   Anxiety    BPH (benign prostatic hyperplasia)    Coronary artery disease    GERD (gastroesophageal reflux disease)    High cholesterol    Hypercholesteremia    Hypertension     Surgical History: Past Surgical History:  Procedure Laterality Date   CARDIAC CATHETERIZATION     stent inserted   CARDIAC SURGERY     COLONOSCOPY  10/19/2006   [id number] friable anal canal with normal rectum. tubular adenoma -cecum   COLONOSCOPY N/A 04/03/2015   Procedure: COLONOSCOPY;  Surgeon: Corbin Ade, MD;  [facility];  Service: Endoscopy;  Laterality: N/A;  1245   HERNIA REPAIR Right    TRANSURETHRAL RESECTION OF PROSTATE N/A 01/29/2013   Procedure: TRANSURETHRAL RESECTION OF THE PROSTATE (TURP);  Surgeon: Ky Barban, MD;  Location: [facility];  Service: Urology;  Laterality: N/A;    Home Medications:  Allergies as of 09/11/2023       Reactions   Demerol [meperidine] Diarrhea, Nausea And Vomiting        Medication List        Accurate as of September 11, 2023 11:13 AM. If you have any questions, ask your nurse or doctor.          acetaminophen 500 MG tablet Commonly known as: TYLENOL Take 1,000 mg by mouth every 6 (six) hours as needed.   cyclobenzaprine 10 MG tablet Commonly known  as: FLEXERIL Take 10 mg by mouth 2 (two) times daily as needed for muscle spasms.   Eliquis 5 MG Tabs tablet Generic drug: apixaban Take 5 mg by mouth 2 (two) times daily.   lisinopril 20 MG tablet Commonly known as: ZESTRIL Take 20 mg by mouth daily.   LORazepam 1 MG tablet Commonly known as: ATIVAN Take 1 mg by mouth every 8 (eight) hours.   metoprolol tartrate 100 MG tablet Commonly known as: LOPRESSOR Take 100 mg by mouth daily.   nitroGLYCERIN 0.4 MG SL tablet Commonly known as: NITROSTAT Place 0.4 mg under the tongue every 5 (five) minutes as needed for chest pain. Last dose 6 months ago per pt 03/30/2015   polyethylene glycol-electrolytes 420 g solution Commonly known as: TriLyte Take 4,000 mLs by mouth as directed.   ranitidine 150 MG tablet Commonly known as: ZANTAC Take 150 mg by mouth daily as needed for heartburn.   simvastatin 40 MG tablet Commonly known as: ZOCOR Take 40 mg by mouth daily.   sulfamethoxazole-trimethoprim 800-160 MG tablet Commonly known as: BACTRIM DS Take 1 tablet by mouth every 12 (twelve) hours.   tamsulosin 0.4 MG Caps capsule Commonly known as: FLOMAX Take 1 capsule (0.4 mg total) by mouth daily.  trandolapril 2 MG tablet Commonly known as: MAVIK Take 2 mg by mouth daily.        Allergies:  Allergies  Allergen Reactions   Demerol [Meperidine] Diarrhea and Nausea And Vomiting    Family History: Family History  Problem Relation Age of Onset   Diabetes Mother    Diabetes Father    Heart attack Father    Diabetes Sister    Diabetes Brother    Heart attack Brother    Kidney disease Sister    Diabetes Brother    Colon cancer Neg Hx    Prostate cancer Brother    Cancer Brother        ?pancreatic    Social History:  reports that he quit smoking about 24 years ago. His smoking use included cigarettes. He started smoking about 64 years ago. He has a 80 pack-year smoking history. He has never used smokeless tobacco.  He reports current alcohol use. He reports that he does not use drugs.   Physical Exam: BP 112/68   Pulse 81   Constitutional:  Alert and oriented, No acute distress. HEENT: [place] AT, moist mucus membranes.  Trachea midline, no masses. Cardiovascular: No clubbing, cyanosis, or edema. Respiratory: Normal respiratory effort, no increased work of breathing. GI: Abdomen is soft, nontender, nondistended, no abdominal masses GU: No CVA tenderness Skin: No rashes, bruises or suspicious lesions. Neurologic: Grossly intact, no focal deficits, moving all 4 extremities. Psychiatric: Normal mood and affect.  Laboratory Data: Lab Results  Component Value Date   WBC 10.3 01/30/2013   HGB 15.7 01/30/2013   HCT 46.0 01/30/2013   MCV 87.8 01/30/2013   PLT 179 01/30/2013    Lab Results  Component Value Date   CREATININE 1.06 01/30/2013    No results found for: "PSA"  No results found for: "TESTOSTERONE"  No results found for: "HGBA1C"  Urinalysis    Component Value Date/Time   COLORURINE AMBER (A) 03/27/2021 0930   APPEARANCEUR Clear 09/19/2022 0936   LABSPEC 1.017 03/27/2021 0930   PHURINE 9.0 (H) 03/27/2021 0930   GLUCOSEU Negative 09/19/2022 0936   HGBUR SMALL (A) 03/27/2021 0930   BILIRUBINUR Negative 09/19/2022 0936   KETONESUR NEGATIVE 03/27/2021 0930   PROTEINUR Trace 09/19/2022 0936   PROTEINUR 100 (A) 03/27/2021 0930   UROBILINOGEN 0.2 12/27/2012 1444   NITRITE Negative 09/19/2022 0936   NITRITE POSITIVE (A) 03/27/2021 0930   LEUKOCYTESUR Negative 09/19/2022 0936   LEUKOCYTESUR LARGE (A) 03/27/2021 0930    Lab Results  Component Value Date   LABMICR Comment 09/19/2022   BACTERIA RARE (A) 03/27/2021    Pertinent Imaging:   Results for orders placed during the hospital encounter of 06/17/15  US Renal  Narrative CLINICAL DATA:  Left renal cyst followup  EXAM: RENAL / URINARY TRACT ULTRASOUND COMPLETE  COMPARISON:  Renal ultrasound  01/03/2013  FINDINGS: Right Kidney:  Length: 11.1 cm. Mild right hydronephrosis which was not present previously. No renal mass  Left Kidney:  Length: 11.8 cm. Mild left hydronephrosis which was not present previously. Left lower pole cyst 12 x 9 mm is stable.  Bladder:  Distended urinary bladder. Soft tissue mass in the base of the bladder on the left measures 2.3 x 2.0 x 2.1 cm. This was not seen previously. Previously there was a Foley catheter in the bladder which is decompressed. Left ureteral jet identified. Right ureteral jet not visualized  IMPRESSION: Mild hydronephrosis bilaterally likely due to a full urinary bladder.  12 x 9 mm  left lower pole cyst is stable  Bladder mass on the left may represent a neoplasm. Urologic evaluation recommended.   Electronically Signed By: Marlan Palau M.D. On: 06/17/2015 09:43     Assessment & Plan:    1. BPH with obstruction/lower urinary tract symptoms (Primary) Cont tamsulosin.  - Urinalysis, Routine w reflex microscopic - BLADDER SCAN AMB NON-IMAGING  2. Gross hematuria - discussed nature r/b/a to CT and cystoscopy - he will proceed. Reviewed on monitor.   No follow-ups on file.  Jerilee Field, MD  [facility]  [address] [place], [place] [address] [phone]

## 2023-09-26 ENCOUNTER — Encounter (HOSPITAL_COMMUNITY): Payer: Self-pay | Admitting: Radiology

## 2023-09-26 ENCOUNTER — Ambulatory Visit (HOSPITAL_COMMUNITY)
Admission: RE | Admit: 2023-09-26 | Discharge: 2023-09-26 | Disposition: A | Discharge status: Home / Self Care | Source: Ambulatory Visit | Attending: Urology | Admitting: Urology

## 2023-09-26 DIAGNOSIS — N281 Cyst of kidney, acquired: Secondary | ICD-10-CM | POA: Diagnosis not present

## 2023-09-26 DIAGNOSIS — R310 Gross hematuria: Secondary | ICD-10-CM | POA: Diagnosis not present

## 2023-09-26 DIAGNOSIS — R319 Hematuria, unspecified: Secondary | ICD-10-CM | POA: Diagnosis not present

## 2023-09-26 DIAGNOSIS — N400 Enlarged prostate without lower urinary tract symptoms: Secondary | ICD-10-CM | POA: Diagnosis not present

## 2023-09-26 MED ORDER — IOHEXOL 300 MG/ML  SOLN
125.0000 mL | Freq: Once | INTRAMUSCULAR | Status: AC | PRN
  Administered 2023-09-26: 125 mL via INTRAVENOUS

## 2023-09-27 LAB — POCT I-STAT CREATININE: Creatinine, Ser: 1.5 mg/dL — ABNORMAL HIGH (ref 0.61–1.24)

## 2023-10-16 DIAGNOSIS — H25813 Combined forms of age-related cataract, bilateral: Secondary | ICD-10-CM | POA: Diagnosis not present

## 2023-11-16 DIAGNOSIS — H02831 Dermatochalasis of right upper eyelid: Secondary | ICD-10-CM | POA: Diagnosis not present

## 2023-11-16 DIAGNOSIS — H02834 Dermatochalasis of left upper eyelid: Secondary | ICD-10-CM | POA: Diagnosis not present

## 2023-11-16 DIAGNOSIS — H01001 Unspecified blepharitis right upper eyelid: Secondary | ICD-10-CM | POA: Diagnosis not present

## 2023-11-16 DIAGNOSIS — H25813 Combined forms of age-related cataract, bilateral: Secondary | ICD-10-CM | POA: Diagnosis not present

## 2023-11-20 ENCOUNTER — Ambulatory Visit (INDEPENDENT_AMBULATORY_CARE_PROVIDER_SITE_OTHER): Admitting: Urology

## 2023-11-20 VITALS — BP 141/87 | HR 80

## 2023-11-20 DIAGNOSIS — R310 Gross hematuria: Secondary | ICD-10-CM | POA: Diagnosis not present

## 2023-11-20 DIAGNOSIS — N401 Enlarged prostate with lower urinary tract symptoms: Secondary | ICD-10-CM

## 2023-11-20 DIAGNOSIS — N138 Other obstructive and reflux uropathy: Secondary | ICD-10-CM

## 2023-11-20 LAB — URINALYSIS, ROUTINE W REFLEX MICROSCOPIC
Bilirubin, UA: NEGATIVE
Glucose, UA: NEGATIVE
Ketones, UA: NEGATIVE
Leukocytes,UA: NEGATIVE
Nitrite, UA: NEGATIVE
Protein,UA: NEGATIVE
RBC, UA: NEGATIVE
Specific Gravity, UA: 1.025 (ref 1.005–1.030)
Urobilinogen, Ur: 0.2 mg/dL (ref 0.2–1.0)
pH, UA: 6 (ref 5.0–7.5)

## 2023-11-20 MED ORDER — CIPROFLOXACIN HCL 500 MG PO TABS
500.0000 mg | ORAL_TABLET | Freq: Once | ORAL | Status: AC
  Administered 2023-11-20: 500 mg via ORAL

## 2023-11-20 NOTE — Progress Notes (Unsigned)
  [place]  11/20/23  CC: No chief complaint on file.   HPI: F/u -   1) BPH-patient with a history of TURP in 2014 with Dr. Davie Essex.  He is on tamsulosin .  Had a recent UTI.  CT in 2017 revealed a 35 g prostate with TURP defect.  AUA symptom score was 5. He voids with a good stream and feels empty. No bothersome LUTS. No dysuria.   2) gross hematuria - noted in Jan 2025. Red urine. No clots -  resolved. He is on eliquis. His Nov 2024 Cr 1.27 and gfr 57.   Today, seen for the above. Apr 2025 CT benign - prostate ~ 50 g. TURP defect. Cysto today - Jun 2025 - benign. Partially resected prostatic urethra with moderate BPH and boderline BOO. Anterior intravesical tissue.   Blood pressure (!) 141/87, pulse 80. NED. A&Ox3.   No respiratory distress   Abd soft, NT, ND Normal phallus with bilateral descended testicles  Cystoscopy Procedure Note  Patient identification was confirmed, informed consent was obtained, and patient was prepped using Betadine solution.  Lidocaine  jelly was administered per urethral meatus.     Pre-Procedure: - Inspection reveals a normal caliber ureteral meatus.  Procedure: The flexible cystoscope was introduced without difficulty - No urethral strictures/lesions are present. - Partially resected prostatic urethra with moderate BPH and boderline BOO. Anterior intravesical tissue.  - patent  bladder neck - Bilateral ureteral orifices identified - Bladder mucosa  reveals no ulcers, tumors, or lesions - No bladder stones - No trabeculation  Retroflexion shows normal bladder neck/bladder.    Post-Procedure: - Patient tolerated the procedure well  Assessment/ Plan:   Gross hematuria - benign eval. Likely due to BPH regrowth. Good TURP or AQB candidate if needed.   Christina Coyer, MD

## 2024-01-08 DIAGNOSIS — I2510 Atherosclerotic heart disease of native coronary artery without angina pectoris: Secondary | ICD-10-CM | POA: Diagnosis not present

## 2024-01-08 DIAGNOSIS — H25811 Combined forms of age-related cataract, right eye: Secondary | ICD-10-CM | POA: Diagnosis not present

## 2024-01-08 DIAGNOSIS — I10 Essential (primary) hypertension: Secondary | ICD-10-CM | POA: Diagnosis not present

## 2024-01-08 DIAGNOSIS — N1831 Chronic kidney disease, stage 3a: Secondary | ICD-10-CM | POA: Diagnosis not present

## 2024-01-15 DIAGNOSIS — I10 Essential (primary) hypertension: Secondary | ICD-10-CM | POA: Diagnosis not present

## 2024-01-15 DIAGNOSIS — N1831 Chronic kidney disease, stage 3a: Secondary | ICD-10-CM | POA: Diagnosis not present

## 2024-01-15 DIAGNOSIS — I4821 Permanent atrial fibrillation: Secondary | ICD-10-CM | POA: Diagnosis not present

## 2024-01-16 ENCOUNTER — Encounter (HOSPITAL_COMMUNITY)
Admission: RE | Admit: 2024-01-16 | Discharge: 2024-01-16 | Disposition: A | Discharge status: Home / Self Care | Source: Ambulatory Visit | Attending: Ophthalmology | Admitting: Ophthalmology

## 2024-01-16 ENCOUNTER — Encounter (HOSPITAL_COMMUNITY): Payer: Self-pay

## 2024-01-16 ENCOUNTER — Other Ambulatory Visit: Payer: Self-pay

## 2024-01-17 NOTE — H&P (Signed)
 Surgical History & Physical  Patient Name: Connor [name]  DOB: 01/01/1946  Surgery: Cataract extraction with intraocular lens implant phacoemulsification; Right Eye Surgeon: Lynwood Hermann MD Surgery Date: 01/19/2024 Pre-Op Date: 11/16/2023  HPI: A 64 Yr. old male patient 1. The patient is a new patient referred by Dr. Darroll for a Cataract Evaluation. The patient complains of difficulty when reading fine print, books, newspaper, instructions etc., which began 1 year ago. Both eyes are affected. The episode is constant. The patient describes foggy symptoms affecting their eyes/vision. This is negatively affecting the patient's quality of life and the patient is unable to function adequately in life with the current level of vision. HPI Completed by Dr. Lynwood Hermann  Medical History: Cataracts  Arthritis Heart Problem Stroke  Review of Systems Cardiovascular High Blood Pressure Musculoskeletal ARTHRITIS Neurological Stroke All recorded systems are negative except as noted above.  Social Former smoker   Medication Prednisolone-moxiflox-bromfen,  Lisinopril, Tamsulosin , Metoprolol  tartrate, Simvastatin   Sx/Procedures None  Drug Allergies  Demerol   History & Physical: Heent: cataracts NECK: supple without bruits LUNGS: lungs clear to auscultation CV: regular rate and rhythm Abdomen: soft and non-tender  Impression & Plan: Assessment: 1.  COMBINED FORMS AGE RELATED CATARACT; Both Eyes (H25.813) 2.  DERMATOCHALASIS, no surgery; Right Upper Lid, Left Upper Lid (H02.831, H02.834) 3.  BLEPHARITIS; Right Upper Lid, Right Lower Lid, Left Upper Lid, Left Lower Lid (H01.001, H01.002,H01.004,H01.005) 4.  Pinguecula; Both Eyes (H11.153) 5.  ASTIGMATISM, REGULAR; Both Eyes (H52.223)  Plan: 1.  Cataract accounts for the patient's decreased vision. This visual impairment is not correctable with a tolerable change in glasses or contact lenses. Cataract surgery with an implantation of a  new lens should significantly improve the visual and functional status of the patient.Discussed all risks, benefits, alternatives, and potential complications. Discussed the procedures and recovery. Patient desires to have surgery. A-scan ordered and performed today for intra-ocular lens calculations. The surgery will be performed in order to improve vision for driving, reading, and for eye examinations. Recommend phacoemulsification with intra-ocular lens. Recommend Dextenza for post-operative pain and inflammation. History of refractive Surgery: None Use of Eye Pressure Lowering Drops: None Right Eye worse. OD first, then OS. Dilates well - shugarcaine or Lidocaine +Omidira by protocol History of Flomax  Use. Recommend Toric Lens OU  2.  Asymptomatic, recommend observation for now. Findings, prognosis and treatment options reviewed.  3.  Blepharitis is present - recommend regular lid cleaning.  4.  Observe; Artificial tears as needed for irritation.  5.  Recommend Toric Lens OU.

## 2024-01-19 ENCOUNTER — Ambulatory Visit (HOSPITAL_COMMUNITY): Admitting: Anesthesiology

## 2024-01-19 ENCOUNTER — Encounter (HOSPITAL_COMMUNITY)
Admission: RE | Disposition: A | Discharge status: Home / Self Care | Payer: Self-pay | Source: Home / Self Care | Attending: Ophthalmology

## 2024-01-19 ENCOUNTER — Ambulatory Visit (HOSPITAL_COMMUNITY)
Admission: RE | Admit: 2024-01-19 | Discharge: 2024-01-19 | Disposition: A | Discharge status: Home / Self Care | Attending: Ophthalmology | Admitting: Ophthalmology

## 2024-01-19 ENCOUNTER — Ambulatory Visit (HOSPITAL_BASED_OUTPATIENT_CLINIC_OR_DEPARTMENT_OTHER): Admitting: Anesthesiology

## 2024-01-19 DIAGNOSIS — H01001 Unspecified blepharitis right upper eyelid: Secondary | ICD-10-CM | POA: Insufficient documentation

## 2024-01-19 DIAGNOSIS — H2511 Age-related nuclear cataract, right eye: Secondary | ICD-10-CM | POA: Diagnosis not present

## 2024-01-19 DIAGNOSIS — H01005 Unspecified blepharitis left lower eyelid: Secondary | ICD-10-CM | POA: Insufficient documentation

## 2024-01-19 DIAGNOSIS — H01002 Unspecified blepharitis right lower eyelid: Secondary | ICD-10-CM | POA: Diagnosis not present

## 2024-01-19 DIAGNOSIS — H25811 Combined forms of age-related cataract, right eye: Secondary | ICD-10-CM | POA: Diagnosis not present

## 2024-01-19 DIAGNOSIS — H02834 Dermatochalasis of left upper eyelid: Secondary | ICD-10-CM | POA: Diagnosis not present

## 2024-01-19 DIAGNOSIS — I10 Essential (primary) hypertension: Secondary | ICD-10-CM | POA: Diagnosis not present

## 2024-01-19 DIAGNOSIS — Z87891 Personal history of nicotine dependence: Secondary | ICD-10-CM | POA: Diagnosis not present

## 2024-01-19 DIAGNOSIS — H02831 Dermatochalasis of right upper eyelid: Secondary | ICD-10-CM | POA: Diagnosis not present

## 2024-01-19 DIAGNOSIS — H01004 Unspecified blepharitis left upper eyelid: Secondary | ICD-10-CM | POA: Diagnosis not present

## 2024-01-19 DIAGNOSIS — I2510 Atherosclerotic heart disease of native coronary artery without angina pectoris: Secondary | ICD-10-CM | POA: Diagnosis not present

## 2024-01-19 DIAGNOSIS — Z8673 Personal history of transient ischemic attack (TIA), and cerebral infarction without residual deficits: Secondary | ICD-10-CM | POA: Insufficient documentation

## 2024-01-19 DIAGNOSIS — H52201 Unspecified astigmatism, right eye: Secondary | ICD-10-CM | POA: Diagnosis not present

## 2024-01-19 DIAGNOSIS — H11153 Pinguecula, bilateral: Secondary | ICD-10-CM | POA: Insufficient documentation

## 2024-01-19 DIAGNOSIS — Z955 Presence of coronary angioplasty implant and graft: Secondary | ICD-10-CM | POA: Diagnosis not present

## 2024-01-19 SURGERY — PHACOEMULSIFICATION, CATARACT, WITH IOL INSERTION
Anesthesia: Monitor Anesthesia Care | Site: Eye | Laterality: Right

## 2024-01-19 MED ORDER — STERILE WATER FOR IRRIGATION IR SOLN
Status: DC | PRN
  Administered 2024-01-19: 250 mL

## 2024-01-19 MED ORDER — PHENYLEPHRINE-KETOROLAC 1-0.3 % IO SOLN
INTRAOCULAR | Status: DC | PRN
  Administered 2024-01-19: 500 mL via OPHTHALMIC

## 2024-01-19 MED ORDER — TROPICAMIDE 1 % OP SOLN
1.0000 [drp] | OPHTHALMIC | Status: AC | PRN
  Administered 2024-01-19 (×3): 1 [drp] via OPHTHALMIC

## 2024-01-19 MED ORDER — TETRACAINE HCL 0.5 % OP SOLN
1.0000 [drp] | OPHTHALMIC | Status: AC | PRN
  Administered 2024-01-19 (×3): 1 [drp] via OPHTHALMIC

## 2024-01-19 MED ORDER — LACTATED RINGERS IV SOLN: INTRAVENOUS | Status: DC

## 2024-01-19 MED ORDER — MOXIFLOXACIN HCL 5 MG/ML IO SOLN
INTRAOCULAR | Status: DC | PRN
  Administered 2024-01-19: .2 mL via INTRACAMERAL

## 2024-01-19 MED ORDER — SODIUM HYALURONATE 23MG/ML IO SOSY
PREFILLED_SYRINGE | INTRAOCULAR | Status: DC | PRN
  Administered 2024-01-19: .6 mL via INTRAOCULAR

## 2024-01-19 MED ORDER — LIDOCAINE HCL (PF) 1 % IJ SOLN
INTRAMUSCULAR | Status: DC | PRN
  Administered 2024-01-19: 1 mL

## 2024-01-19 MED ORDER — PHENYLEPHRINE HCL 2.5 % OP SOLN
1.0000 [drp] | OPHTHALMIC | Status: AC | PRN
  Administered 2024-01-19 (×3): 1 [drp] via OPHTHALMIC

## 2024-01-19 MED ORDER — BSS IO SOLN
INTRAOCULAR | Status: DC | PRN
  Administered 2024-01-19: 15 mL via INTRAOCULAR

## 2024-01-19 MED ORDER — MIDAZOLAM HCL 2 MG/2ML IJ SOLN
INTRAMUSCULAR | Status: AC
  Filled 2024-01-19: qty 2

## 2024-01-19 MED ORDER — POVIDONE-IODINE 5 % OP SOLN
OPHTHALMIC | Status: DC | PRN
Start: 2024-01-19 — End: 2024-01-19
  Administered 2024-01-19: 1 via OPHTHALMIC

## 2024-01-19 MED ORDER — SODIUM CHLORIDE 0.9% FLUSH
INTRAVENOUS | Status: DC | PRN
Start: 2024-01-19 — End: 2024-01-19
  Administered 2024-01-19: 5 mL via INTRAVENOUS

## 2024-01-19 MED ORDER — LIDOCAINE HCL 3.5 % OP GEL: 1.0000 | Freq: Once | OPHTHALMIC | Status: DC

## 2024-01-19 MED ORDER — SODIUM HYALURONATE 10 MG/ML IO SOLUTION
PREFILLED_SYRINGE | INTRAOCULAR | Status: DC | PRN
  Administered 2024-01-19: .85 mL via INTRAOCULAR

## 2024-01-19 MED ORDER — MIDAZOLAM HCL 2 MG/2ML IJ SOLN
INTRAMUSCULAR | Status: DC | PRN
  Administered 2024-01-19: 2 mg via INTRAVENOUS

## 2024-01-19 SURGICAL SUPPLY — 11 items
CLOTH BEACON ORANGE TIMEOUT ST (SAFETY) ×1 IMPLANT
EYE SHIELD UNIVERSAL CLEAR (GAUZE/BANDAGES/DRESSINGS) IMPLANT
FEE CATARACT SUITE SIGHTPATH (MISCELLANEOUS) ×1 IMPLANT
GLOVE BIOGEL PI IND STRL 7.0 (GLOVE) ×2 IMPLANT
LENS IOL EYHANCE TRC 375 23.0 IMPLANT
NDL HYPO 18GX1.5 BLUNT FILL (NEEDLE) ×1 IMPLANT
NEEDLE HYPO 18GX1.5 BLUNT FILL (NEEDLE) ×1 IMPLANT
PAD ARMBOARD POSITIONER FOAM (MISCELLANEOUS) ×1 IMPLANT
SYR TB 1ML LL NO SAFETY (SYRINGE) ×1 IMPLANT
TAPE SURG TRANSPORE 1 IN (GAUZE/BANDAGES/DRESSINGS) IMPLANT
WATER STERILE IRR 250ML POUR (IV SOLUTION) ×1 IMPLANT

## 2024-01-19 NOTE — Op Note (Signed)
 aDate of procedure: 01/19/24  Pre-operative diagnosis: Visually significant age-related cataract, Right Eye; Visually Significant Astigmatism, Right Eye (H25.?1)  Post-operative diagnosis: Visually significant age-related cataract, Right Eye; Visually Significant Astigmatism, Right Eye  Procedure: Removal of cataract via phacoemulsification and insertion of intra-ocular lens Vicci and Johnson DIU375 +23.0D into the capsular bag of the Right Eye  Attending surgeon: Lynwood LABOR. Renwick Asman, MD, MA  Anesthesia: MAC, Topical Akten  Complications: None  Estimated Blood Loss: <41mL (minimal)  Specimens: None  Implants: As above  Indications:  Visually significant age-related cataract, Right Eye; Visually Significant Astigmatism, Right Eye  Procedure:  The patient was seen and identified in the pre-operative area. The operative eye was identified and dilated.  The operative eye was marked.  Pre-operative toric markers were used to mark the eye at 0 and 180 degrees. Topical anesthesia was administered to the operative eye.     The patient was then to the operative suite and placed in the supine position.  A timeout was performed confirming the patient, procedure to be performed, and all other relevant information.   The patient's face was prepped and draped in the usual fashion for intra-ocular surgery.  A lid speculum was placed into the operative eye and the surgical microscope moved into place and focused.  A superotemporal paracentesis was created using a 20 gauge paracentesis blade. Omidria  was injected into the anterior chamber.  Shugarcaine was injected into the anterior chamber.  Viscoelastic was injected into the anterior chamber.  A temporal clear-corneal main wound incision was created using a 2.89mm microkeratome.  A continuous curvilinear capsulorrhexis was initiated using an irrigating cystitome and completed using capsulorrhexis forceps.  Hydrodissection and hydrodeliniation were performed.   Viscoelastic was injected into the anterior chamber.  A phacoemulsification handpiece and a chopper as a second instrument were used to remove the nucleus and epinucleus. The irrigation/aspiration handpiece was used to remove any remaining cortical material.   The capsular bag was reinflated with viscoelastic, checked, and found to be intact.  The intraocular lens was inserted into the capsular bag and dialed into place using a Kuglen hook to ?? degrees.  The irrigation/aspiration handpiece was used to remove any remaining viscoelastic.  The clear corneal wound and paracentesis wounds were then hydrated and checked with Weck-Cels to be watertight. 0.1mL of moxifloxacin  was injected into the anterior chamber. The lid-speculum was removed.  The drape was removed. The patient's face was cleaned with a wet and dry 4x4. A clear shield was taped over the eye. The patient was taken to the post-operative care unit in good condition, having tolerated the procedure well.  Post-Op Instructions: The patient will follow up at [facility] for a same day post-operative evaluation and will receive all other orders and instructions.

## 2024-01-19 NOTE — Discharge Instructions (Addendum)
 Please discharge patient when stable, will follow up today with Dr. June Leap at the [facility] office immediately following discharge.  Leave shield in place until visit.  All paperwork with discharge instructions will be given at the office.  [facility] Address:  [address]  [place], [place] [address]

## 2024-01-19 NOTE — Transfer of Care (Signed)
 Immediate Anesthesia Transfer of Care Note  Patient: Connor [name]  Procedure(s) Performed: PHACOEMULSIFICATION, CATARACT, WITH IOL INSERTION (Right: Eye)  Patient Location: Short Stay  Anesthesia Type:MAC  Level of Consciousness: awake, alert , oriented, and patient cooperative  Airway & Oxygen Therapy: Patient Spontanous Breathing  Post-op Assessment: Report given to RN, Post -op Vital signs reviewed and stable, and Patient moving all extremities X 4  Post vital signs: Reviewed and stable  Last Vitals:  Vitals Value Taken Time  BP    Temp    Pulse    Resp    SpO2      Last Pain:  Vitals:   01/19/24 0751  PainSc: 0-No pain         Complications: No notable events documented.

## 2024-01-19 NOTE — Anesthesia Postprocedure Evaluation (Signed)
 Anesthesia Post Note  Patient: TORENCE PALMERI  Procedure(s) Performed: PHACOEMULSIFICATION, CATARACT, WITH IOL INSERTION (Right: Eye)  Patient location during evaluation: Phase II Anesthesia Type: MAC Level of consciousness: awake and alert Pain management: pain level controlled Vital Signs Assessment: post-procedure vital signs reviewed and stable Respiratory status: spontaneous breathing, nonlabored ventilation and respiratory function stable Cardiovascular status: stable and blood pressure returned to baseline Postop Assessment: no apparent nausea or vomiting Anesthetic complications: no   There were no known notable events for this encounter.   Last Vitals:  Vitals:   01/19/24 0751 01/19/24 0928  BP: 131/72 104/68  Pulse: 73 (!) 36  Resp: 18 11  Temp:  36.6 C  SpO2: 97% 96%    Last Pain:  Vitals:   01/19/24 0928  TempSrc: Oral  PainSc: 0-No pain                 Monya Kozakiewicz L Bryndan Bilyk

## 2024-01-19 NOTE — Interval H&P Note (Signed)
 History and Physical Interval Note:  01/19/2024 9:03 AM  Connor [name]  has presented today for surgery, with the diagnosis of combined forms age related cataract, right eye.  The various methods of treatment have been discussed with the patient and family. After consideration of risks, benefits and other options for treatment, the patient has consented to  Procedure(s) with comments: PHACOEMULSIFICATION, CATARACT, WITH IOL INSERTION (Right) - CDE: as a surgical intervention.  The patient's history has been reviewed, patient examined, no change in status, stable for surgery.  I have reviewed the patient's chart and labs.  Questions were answered to the patient's satisfaction.     Connor [name]

## 2024-01-19 NOTE — Anesthesia Preprocedure Evaluation (Addendum)
 Anesthesia Evaluation  Patient identified by MRN, date of birth, ID band Patient awake    Reviewed: Allergy & Precautions, NPO status , Patient's Chart, lab work & pertinent test results  Airway Mallampati: II  TM Distance: >3 FB Neck ROM: Full    Dental  (+) Dental Advisory Given, Edentulous Upper, Missing,    Pulmonary former smoker   Pulmonary exam normal breath sounds clear to auscultation       Cardiovascular hypertension, + CAD and + Cardiac Stents  Normal cardiovascular exam Rhythm:Regular Rate:Normal     Neuro/Psych   Anxiety     CVA    GI/Hepatic Neg liver ROS,GERD  ,,  Endo/Other  negative endocrine ROS    Renal/GU negative Renal ROS     Musculoskeletal negative musculoskeletal ROS (+)    Abdominal   Peds  Hematology negative hematology ROS (+)   Anesthesia Other Findings   Reproductive/Obstetrics                              Anesthesia Physical Anesthesia Plan  ASA: 3  Anesthesia Plan: MAC   Post-op Pain Management: Minimal or no pain anticipated   Induction: Intravenous  PONV Risk Score and Plan: Midazolam   Airway Management Planned: Nasal Cannula and Natural Airway  Additional Equipment: None  Intra-op Plan:   Post-operative Plan:   Informed Consent: I have reviewed the patients History and Physical, chart, labs and discussed the procedure including the risks, benefits and alternatives for the proposed anesthesia with the patient or authorized representative who has indicated his/her understanding and acceptance.     Dental advisory given  Plan Discussed with: CRNA  Anesthesia Plan Comments:          Anesthesia Quick Evaluation

## 2024-01-22 ENCOUNTER — Encounter (HOSPITAL_COMMUNITY): Payer: Self-pay | Admitting: Ophthalmology

## 2024-01-29 DIAGNOSIS — H25812 Combined forms of age-related cataract, left eye: Secondary | ICD-10-CM | POA: Diagnosis not present

## 2024-01-31 ENCOUNTER — Other Ambulatory Visit (HOSPITAL_COMMUNITY)

## 2024-02-07 ENCOUNTER — Encounter (HOSPITAL_COMMUNITY): Payer: Self-pay

## 2024-02-07 ENCOUNTER — Other Ambulatory Visit: Payer: Self-pay

## 2024-02-07 ENCOUNTER — Encounter (HOSPITAL_COMMUNITY)
Admission: RE | Admit: 2024-02-07 | Discharge: 2024-02-07 | Disposition: A | Discharge status: Home / Self Care | Source: Ambulatory Visit | Attending: Ophthalmology | Admitting: Ophthalmology

## 2024-02-07 NOTE — Pre-Procedure Instructions (Signed)
 Attempted pre-op phone call. Left VM for him to call us back.

## 2024-02-07 NOTE — H&P (Signed)
 Surgical History & Physical  Patient Name: Connor [name]  DOB: [birth year]  Surgery: Cataract extraction with intraocular lens implant phacoemulsification; Left Eye Surgeon: Lynwood Hermann MD Surgery Date: 02/09/2024 Pre-Op Date: 01/22/2024  HPI: A 31 Yr. old male patient 1. The patient is returning for a 3 day po/pre op os both eyes. Since the last visit, the affected area is doing well. The patient's vision is stable. Patient is following medication instructions. The patient experiences no flashes, floater, shadow, curtain or veil. Gtts: post op meds od (pt compliant with eyedrops) Patient also presents for persistently blurry vision in the left eye. The blurry vision is constant, has been present for several months, and is worsening. He is having trouble with reading fine print, filling out forms, and hazy/ blurred vision. This is negatively affecting the patient's quality of life and the patient is unable to function adequately in life with the current level of vision. HPI was performed by Lynwood Hermann .  Medical History: Cataracts  Arthritis Heart Problem Stroke  Review of Systems Cardiovascular High Blood Pressure Musculoskeletal ARTHRITIS Neurological Stroke All recorded systems are negative except as noted above.  Social Former smoker   Medication Prednisolone-moxiflox-bromfen, Prednisolone-moxiflox-bromfen,  Lisinopril, Tamsulosin , Metoprolol  tartrate, Simvastatin   Sx/Procedures Phaco c IOL OD - toric  Drug Allergies  Demerol   History & Physical: Heent: cataract NECK: supple without bruits LUNGS: lungs clear to auscultation CV: regular rate and rhythm Abdomen: soft and non-tender  Impression & Plan: Assessment: 1.  CATARACT EXTRACTION STATUS; Right Eye (Z98.41) 2.  COMBINED FORMS AGE RELATED CATARACT; Left Eye (H25.812) 3.  ASTIGMATISM, REGULAR; Both Eyes (H52.223)  Plan: 1.  3 days after cataract surgery. Doing well with improved vision and normal eye pressure.  Call with any problems or concerns. Continue Pred-Moxi-Brom 3x/day for 4 days, then 2x/day for 3 more weeks.,  2.  Use of Eye Pressure Lowering Drops: None Right Eye worse. OD first, then OS. Recommend Toric Lens OU Cataract accounts for the patient's decreased vision. This visual impairment is not correctable with a tolerable change in glasses or contact lenses. Cataract surgery with an implantation of a new lens should significantly improve the visual and functional status of the patient. Recommend phacoemulsification with intraocular lens. Discussed all risks, benefits, alternatives, and potential complications. Discussed the procedures and recovery. The patient desires to have surgery. A-scan/Biometry ordered and will be performed for intraocular lens calculations. The surgery will be performed in order to improve vision for driving, reading, and for eye examinations. Recommend Dextenza for post-operative pain and inflammation. Educational materials provided: Cataract. History of corneal refractive Surgery: History of Previous Ocular Surgery (PPV, other): None History of ocular trauma: None Pupil Status: Dilates well - shugarcaine or Lidocaine +Omidira by protocol Left Eye. Refractive Goal: [place] Surgery required to correct imbalance of vision. Recommend Toric Lens.  3.  Recommend Toric Lens OU.

## 2024-02-09 ENCOUNTER — Encounter (HOSPITAL_COMMUNITY)
Admission: RE | Disposition: A | Discharge status: Home / Self Care | Payer: Self-pay | Source: Home / Self Care | Attending: Ophthalmology

## 2024-02-09 ENCOUNTER — Ambulatory Visit (HOSPITAL_COMMUNITY): Admitting: Anesthesiology

## 2024-02-09 ENCOUNTER — Encounter (HOSPITAL_COMMUNITY): Payer: Self-pay | Admitting: Ophthalmology

## 2024-02-09 ENCOUNTER — Ambulatory Visit (HOSPITAL_COMMUNITY)
Admission: RE | Admit: 2024-02-09 | Discharge: 2024-02-09 | Disposition: A | Discharge status: Home / Self Care | Attending: Ophthalmology | Admitting: Ophthalmology

## 2024-02-09 ENCOUNTER — Other Ambulatory Visit: Payer: Self-pay

## 2024-02-09 DIAGNOSIS — H25812 Combined forms of age-related cataract, left eye: Secondary | ICD-10-CM | POA: Diagnosis not present

## 2024-02-09 DIAGNOSIS — H52202 Unspecified astigmatism, left eye: Secondary | ICD-10-CM | POA: Insufficient documentation

## 2024-02-09 DIAGNOSIS — K219 Gastro-esophageal reflux disease without esophagitis: Secondary | ICD-10-CM | POA: Diagnosis not present

## 2024-02-09 DIAGNOSIS — I2510 Atherosclerotic heart disease of native coronary artery without angina pectoris: Secondary | ICD-10-CM | POA: Insufficient documentation

## 2024-02-09 DIAGNOSIS — Z87891 Personal history of nicotine dependence: Secondary | ICD-10-CM

## 2024-02-09 DIAGNOSIS — I10 Essential (primary) hypertension: Secondary | ICD-10-CM

## 2024-02-09 DIAGNOSIS — Z955 Presence of coronary angioplasty implant and graft: Secondary | ICD-10-CM | POA: Insufficient documentation

## 2024-02-09 DIAGNOSIS — Z8673 Personal history of transient ischemic attack (TIA), and cerebral infarction without residual deficits: Secondary | ICD-10-CM | POA: Diagnosis not present

## 2024-02-09 SURGERY — PHACOEMULSIFICATION, CATARACT, WITH IOL INSERTION
Anesthesia: Monitor Anesthesia Care | Site: Eye | Laterality: Left

## 2024-02-09 MED ORDER — POVIDONE-IODINE 5 % OP SOLN
OPHTHALMIC | Status: DC | PRN
  Administered 2024-02-09: 1 via OPHTHALMIC

## 2024-02-09 MED ORDER — SODIUM HYALURONATE 23MG/ML IO SOSY
PREFILLED_SYRINGE | INTRAOCULAR | Status: DC | PRN
  Administered 2024-02-09: .6 mL via INTRAOCULAR

## 2024-02-09 MED ORDER — MIDAZOLAM HCL 2 MG/2ML IJ SOLN
INTRAMUSCULAR | Status: AC
  Filled 2024-02-09: qty 2

## 2024-02-09 MED ORDER — PHENYLEPHRINE-KETOROLAC 1-0.3 % IO SOLN
INTRAOCULAR | Status: DC | PRN
  Administered 2024-02-09: 500 mL via OPHTHALMIC

## 2024-02-09 MED ORDER — LIDOCAINE HCL 3.5 % OP GEL: 1.0000 | Freq: Once | OPHTHALMIC | Status: DC

## 2024-02-09 MED ORDER — TETRACAINE HCL 0.5 % OP SOLN
1.0000 [drp] | OPHTHALMIC | Status: AC | PRN
  Administered 2024-02-09 (×3): 1 [drp] via OPHTHALMIC

## 2024-02-09 MED ORDER — LACTATED RINGERS IV SOLN: INTRAVENOUS | Status: DC

## 2024-02-09 MED ORDER — LACTATED RINGERS IV SOLN
INTRAVENOUS | Status: DC
Start: 2024-02-09 — End: 2024-02-11

## 2024-02-09 MED ORDER — SODIUM CHLORIDE 0.9% FLUSH
INTRAVENOUS | Status: DC | PRN
  Administered 2024-02-09: 8 mL via INTRAVENOUS

## 2024-02-09 MED ORDER — BSS IO SOLN
INTRAOCULAR | Status: DC | PRN
  Administered 2024-02-09: 15 mL via INTRAOCULAR

## 2024-02-09 MED ORDER — TROPICAMIDE 1 % OP SOLN
1.0000 [drp] | OPHTHALMIC | Status: AC | PRN
  Administered 2024-02-09 (×3): 1 [drp] via OPHTHALMIC

## 2024-02-09 MED ORDER — PHENYLEPHRINE HCL 2.5 % OP SOLN
1.0000 [drp] | OPHTHALMIC | Status: AC | PRN
  Administered 2024-02-09 (×3): 1 [drp] via OPHTHALMIC

## 2024-02-09 MED ORDER — LIDOCAINE HCL (PF) 1 % IJ SOLN
INTRAMUSCULAR | Status: DC | PRN
  Administered 2024-02-09: 1 mL

## 2024-02-09 MED ORDER — MOXIFLOXACIN HCL 5 MG/ML IO SOLN
INTRAOCULAR | Status: DC | PRN
  Administered 2024-02-09: .2 mL via INTRACAMERAL

## 2024-02-09 MED ORDER — MIDAZOLAM HCL 2 MG/2ML IJ SOLN
INTRAMUSCULAR | Status: DC | PRN
  Administered 2024-02-09: 1.5 mg via INTRAVENOUS

## 2024-02-09 MED ORDER — SODIUM HYALURONATE 10 MG/ML IO SOLUTION
PREFILLED_SYRINGE | INTRAOCULAR | Status: DC | PRN
  Administered 2024-02-09: .85 mL via INTRAOCULAR

## 2024-02-09 MED ORDER — STERILE WATER FOR IRRIGATION IR SOLN
Status: DC | PRN
  Administered 2024-02-09: 1

## 2024-02-09 SURGICAL SUPPLY — 11 items
CLOTH BEACON ORANGE TIMEOUT ST (SAFETY) ×1 IMPLANT
EYE SHIELD UNIVERSAL CLEAR (GAUZE/BANDAGES/DRESSINGS) IMPLANT
FEE CATARACT SUITE SIGHTPATH (MISCELLANEOUS) ×1 IMPLANT
GLOVE BIOGEL PI IND STRL 7.0 (GLOVE) ×2 IMPLANT
LENS IOL EYHANCE TRC 300 23.0 IMPLANT
NDL HYPO 18GX1.5 BLUNT FILL (NEEDLE) ×1 IMPLANT
NEEDLE HYPO 18GX1.5 BLUNT FILL (NEEDLE) ×1 IMPLANT
PAD ARMBOARD POSITIONER FOAM (MISCELLANEOUS) ×1 IMPLANT
SYR TB 1ML LL NO SAFETY (SYRINGE) ×1 IMPLANT
TAPE SURG TRANSPORE 1 IN (GAUZE/BANDAGES/DRESSINGS) IMPLANT
WATER STERILE IRR 250ML POUR (IV SOLUTION) ×1 IMPLANT

## 2024-02-09 NOTE — Op Note (Signed)
 Date of procedure: 02/09/24  Pre-operative diagnosis: Visually significant age-related combined-form cataract, Left Eye; Visually Significant Astigmatism, Left Eye (H25.812)  Post-operative diagnosis: Visually significant age-related combined cataract, Left Eye; Visually Significant Astigmatism, Left Eye  Procedure: Removal of cataract via phacoemulsification and insertion of intra-ocular lens Vicci and Johnson DIU300 +23.0D into the capsular bag of the Left Eye  Attending surgeon: Lynwood LABOR. Griffon Herberg, MD, MA  Anesthesia: MAC, Topical Akten  Complications: None  Estimated Blood Loss: <32mL (minimal)  Specimens: None  Implants: As above  Indications:  Visually significant age-related cataract, Left Eye; Visually Significant Astigmatism, Left Eye  Procedure:  The patient was seen and identified in the pre-operative area. The operative eye was identified and dilated.  The operative eye was marked.  Pre-operative toric markers were used to mark the eye at 0 and 180 degrees. Topical anesthesia was administered to the operative eye.     The patient was then to the operative suite and placed in the supine position.  A timeout was performed confirming the patient, procedure to be performed, and all other relevant information.   The patient's face was prepped and draped in the usual fashion for intra-ocular surgery.  A lid speculum was placed into the operative eye and the surgical microscope moved into place and focused.  A superotemporal paracentesis was created using a 20 gauge paracentesis blade. Omidria  was injected into the anterior chamber. Shugarcaine was injected into the anterior chamber.  Viscoelastic was injected into the anterior chamber.  A temporal clear-corneal main wound incision was created using a 2.49mm microkeratome.  A continuous curvilinear capsulorrhexis was initiated using an irrigating cystitome and completed using capsulorrhexis forceps.  Hydrodissection and hydrodeliniation  were performed.  Viscoelastic was injected into the anterior chamber.  A phacoemulsification handpiece and a chopper as a second instrument were used to remove the nucleus and epinucleus. The irrigation/aspiration handpiece was used to remove any remaining cortical material.   The capsular bag was reinflated with viscoelastic, checked, and found to be intact.  The eye was marked to the per-op meridian.  The intraocular lens was inserted into the capsular bag and dialed into place using a Kuglen hook to 140 degrees.  The irrigation/aspiration handpiece was used to remove any remaining viscoelastic.  The clear corneal wound and paracentesis wounds were then hydrated and checked with Weck-Cels to be watertight. 0.1mL of moxifloxacin  was injected into the anterior chamber. The lid-speculum was removed.  The drape was removed. The patient's face was cleaned with a wet and dry 4x4.   A clear shield was taped over the eye. The patient was taken to the post-operative care unit in good condition, having tolerated the procedure well.  Post-Op Instructions: The patient will follow up at [facility] for a same day post-operative evaluation and will receive all other orders and instructions.

## 2024-02-09 NOTE — Anesthesia Procedure Notes (Signed)
 Date/Time: 02/09/2024 12:27 PM  Performed by: Graeden Bitner, Belinda L, CRNAOxygen Delivery Method: Nasal cannula

## 2024-02-09 NOTE — Anesthesia Postprocedure Evaluation (Signed)
 Anesthesia Post Note  Patient: Connor [name]  Procedure(s) Performed: PHACOEMULSIFICATION, CATARACT, WITH IOL INSERTION (Left: Eye)  Patient location during evaluation: Phase II Anesthesia Type: MAC Level of consciousness: awake and alert Pain management: pain level controlled Vital Signs Assessment: post-procedure vital signs reviewed and stable Respiratory status: spontaneous breathing, nonlabored ventilation, respiratory function stable and patient connected to nasal cannula oxygen Cardiovascular status: stable and blood pressure returned to baseline Postop Assessment: no apparent nausea or vomiting Anesthetic complications: no   There were no known notable events for this encounter.   Last Vitals:  Vitals:   02/09/24 1123 02/09/24 1242  BP: 136/84 96/69  Pulse: (!) 108 76  Resp: 13   Temp: 36.5 C 36.4 C  SpO2: 100% 98%    Last Pain:  Vitals:   02/09/24 1242  TempSrc: Oral  PainSc:                  Connor [name]

## 2024-02-09 NOTE — Transfer of Care (Addendum)
 Immediate Anesthesia Transfer of Care Note  Patient: Connor [name]  Procedure(s) Performed: PHACOEMULSIFICATION, CATARACT, WITH IOL INSERTION (Left: Eye)  Patient Location: Short Stay  Anesthesia Type:MAC  Level of Consciousness: awake and patient cooperative  Airway & Oxygen Therapy: Patient Spontanous Breathing  Post-op Assessment: Report given to RN and Post -op Vital signs reviewed and stable  Post vital signs: Reviewed and stable  Last Vitals:  Vitals Value Taken Time  BP 96/69 02/09/24   1242  Temp 36.4 02/09/24   1242  Pulse 79 02/09/24   1242  Resp    SpO2 98% 02/09/24   1242    Last Pain:  Vitals:   02/09/24 1123  TempSrc: Oral  PainSc: 0-No pain         Complications: No notable events documented.

## 2024-02-09 NOTE — Interval H&P Note (Signed)
 History and Physical Interval Note:  02/09/2024 12:18 PM  Connor [name]  has presented today for surgery, with the diagnosis of combined forms age related cataract, left eye.  The various methods of treatment have been discussed with the patient and family. After consideration of risks, benefits and other options for treatment, the patient has consented to  Procedure(s) with comments: PHACOEMULSIFICATION, CATARACT, WITH IOL INSERTION (Left) - CDE: as a surgical intervention.  The patient's history has been reviewed, patient examined, no change in status, stable for surgery.  I have reviewed the patient's chart and labs.  Questions were answered to the patient's satisfaction.     [name]

## 2024-02-09 NOTE — Anesthesia Preprocedure Evaluation (Addendum)
 Anesthesia Evaluation  Patient identified by MRN, date of birth, ID band Patient awake    Reviewed: Allergy & Precautions, NPO status , Patient's Chart, lab work & pertinent test results  Airway Mallampati: II  TM Distance: >3 FB Neck ROM: Full    Dental  (+) Dental Advisory Given, Edentulous Upper, Missing,    Pulmonary former smoker   Pulmonary exam normal breath sounds clear to auscultation       Cardiovascular hypertension, + CAD and + Cardiac Stents  Normal cardiovascular exam Rhythm:Regular Rate:Normal     Neuro/Psych   Anxiety     CVA    GI/Hepatic Neg liver ROS,GERD  ,,  Endo/Other  negative endocrine ROS    Renal/GU negative Renal ROS     Musculoskeletal negative musculoskeletal ROS (+)    Abdominal   Peds  Hematology negative hematology ROS (+)   Anesthesia Other Findings   Reproductive/Obstetrics                              Anesthesia Physical Anesthesia Plan  ASA: 3  Anesthesia Plan: MAC   Post-op Pain Management: Minimal or no pain anticipated   Induction: Intravenous  PONV Risk Score and Plan: Midazolam   Airway Management Planned: Nasal Cannula and Natural Airway  Additional Equipment: None  Intra-op Plan:   Post-operative Plan:   Informed Consent: I have reviewed the patients History and Physical, chart, labs and discussed the procedure including the risks, benefits and alternatives for the proposed anesthesia with the patient or authorized representative who has indicated his/her understanding and acceptance.     Dental advisory given  Plan Discussed with: CRNA  Anesthesia Plan Comments:          Anesthesia Quick Evaluation

## 2024-02-09 NOTE — Discharge Instructions (Addendum)
 Please discharge patient when stable, will follow up today with Dr. June Leap at the [facility] office immediately following discharge.  Leave shield in place until visit.  All paperwork with discharge instructions will be given at the office.  [facility] Address:  [address]  [place], [place] [address]

## 2024-02-12 ENCOUNTER — Encounter (HOSPITAL_COMMUNITY): Payer: Self-pay | Admitting: Ophthalmology

## 2024-03-06 ENCOUNTER — Other Ambulatory Visit (HOSPITAL_COMMUNITY)

## 2024-04-05 ENCOUNTER — Other Ambulatory Visit: Payer: Self-pay

## 2024-04-05 ENCOUNTER — Emergency Department (HOSPITAL_COMMUNITY)

## 2024-04-05 ENCOUNTER — Observation Stay (HOSPITAL_COMMUNITY)
Admission: EM | Admit: 2024-04-05 | Discharge: 2024-04-09 | Disposition: A | Discharge status: Home / Self Care | Attending: Emergency Medicine | Admitting: Emergency Medicine

## 2024-04-05 ENCOUNTER — Encounter (HOSPITAL_COMMUNITY): Payer: Self-pay | Admitting: Emergency Medicine

## 2024-04-05 DIAGNOSIS — R000 Tachycardia, unspecified: Secondary | ICD-10-CM | POA: Diagnosis not present

## 2024-04-05 DIAGNOSIS — M47812 Spondylosis without myelopathy or radiculopathy, cervical region: Secondary | ICD-10-CM | POA: Diagnosis not present

## 2024-04-05 DIAGNOSIS — I482 Chronic atrial fibrillation, unspecified: Secondary | ICD-10-CM | POA: Insufficient documentation

## 2024-04-05 DIAGNOSIS — I10 Essential (primary) hypertension: Secondary | ICD-10-CM

## 2024-04-05 DIAGNOSIS — R930 Abnormal findings on diagnostic imaging of skull and head, not elsewhere classified: Secondary | ICD-10-CM | POA: Diagnosis not present

## 2024-04-05 DIAGNOSIS — R54 Age-related physical debility: Secondary | ICD-10-CM | POA: Insufficient documentation

## 2024-04-05 DIAGNOSIS — N179 Acute kidney failure, unspecified: Secondary | ICD-10-CM | POA: Diagnosis not present

## 2024-04-05 DIAGNOSIS — M5021 Other cervical disc displacement,  high cervical region: Secondary | ICD-10-CM | POA: Diagnosis not present

## 2024-04-05 DIAGNOSIS — Z7901 Long term (current) use of anticoagulants: Secondary | ICD-10-CM | POA: Insufficient documentation

## 2024-04-05 DIAGNOSIS — I771 Stricture of artery: Secondary | ICD-10-CM | POA: Diagnosis not present

## 2024-04-05 DIAGNOSIS — N189 Chronic kidney disease, unspecified: Secondary | ICD-10-CM | POA: Insufficient documentation

## 2024-04-05 DIAGNOSIS — M4642 Discitis, unspecified, cervical region: Secondary | ICD-10-CM | POA: Diagnosis not present

## 2024-04-05 DIAGNOSIS — M50221 Other cervical disc displacement at C4-C5 level: Secondary | ICD-10-CM | POA: Diagnosis not present

## 2024-04-05 DIAGNOSIS — I2510 Atherosclerotic heart disease of native coronary artery without angina pectoris: Secondary | ICD-10-CM | POA: Diagnosis not present

## 2024-04-05 DIAGNOSIS — Z79899 Other long term (current) drug therapy: Secondary | ICD-10-CM | POA: Insufficient documentation

## 2024-04-05 DIAGNOSIS — E782 Mixed hyperlipidemia: Secondary | ICD-10-CM | POA: Insufficient documentation

## 2024-04-05 DIAGNOSIS — E1122 Type 2 diabetes mellitus with diabetic chronic kidney disease: Secondary | ICD-10-CM | POA: Diagnosis not present

## 2024-04-05 DIAGNOSIS — R7989 Other specified abnormal findings of blood chemistry: Secondary | ICD-10-CM | POA: Diagnosis not present

## 2024-04-05 DIAGNOSIS — Z87891 Personal history of nicotine dependence: Secondary | ICD-10-CM | POA: Insufficient documentation

## 2024-04-05 DIAGNOSIS — R7881 Bacteremia: Secondary | ICD-10-CM | POA: Diagnosis not present

## 2024-04-05 DIAGNOSIS — M436 Torticollis: Secondary | ICD-10-CM

## 2024-04-05 DIAGNOSIS — R739 Hyperglycemia, unspecified: Secondary | ICD-10-CM | POA: Diagnosis not present

## 2024-04-05 DIAGNOSIS — M4802 Spinal stenosis, cervical region: Secondary | ICD-10-CM | POA: Diagnosis not present

## 2024-04-05 DIAGNOSIS — I700 Atherosclerosis of aorta: Secondary | ICD-10-CM | POA: Diagnosis not present

## 2024-04-05 DIAGNOSIS — K219 Gastro-esophageal reflux disease without esophagitis: Secondary | ICD-10-CM | POA: Insufficient documentation

## 2024-04-05 DIAGNOSIS — M542 Cervicalgia: Secondary | ICD-10-CM | POA: Diagnosis present

## 2024-04-05 DIAGNOSIS — R0602 Shortness of breath: Secondary | ICD-10-CM | POA: Diagnosis not present

## 2024-04-05 DIAGNOSIS — R339 Retention of urine, unspecified: Secondary | ICD-10-CM | POA: Insufficient documentation

## 2024-04-05 DIAGNOSIS — E1165 Type 2 diabetes mellitus with hyperglycemia: Secondary | ICD-10-CM | POA: Diagnosis not present

## 2024-04-05 LAB — URINALYSIS, ROUTINE W REFLEX MICROSCOPIC
Bacteria, UA: NONE SEEN
Bilirubin Urine: NEGATIVE
Glucose, UA: NEGATIVE mg/dL
Ketones, ur: NEGATIVE mg/dL
Leukocytes,Ua: NEGATIVE
Nitrite: NEGATIVE
Protein, ur: NEGATIVE mg/dL
Specific Gravity, Urine: 1.019 (ref 1.005–1.030)
pH: 5 (ref 5.0–8.0)

## 2024-04-05 LAB — BASIC METABOLIC PANEL WITH GFR
Anion gap: 16 — ABNORMAL HIGH (ref 5–15)
BUN: 20 mg/dL (ref 8–23)
CO2: 24 mmol/L (ref 22–32)
Calcium: 10.3 mg/dL (ref 8.9–10.3)
Chloride: 99 mmol/L (ref 98–111)
Creatinine, Ser: 1.39 mg/dL — ABNORMAL HIGH (ref 0.61–1.24)
GFR, Estimated: 51 mL/min — ABNORMAL LOW (ref >60)
Glucose, Bld: 162 mg/dL — ABNORMAL HIGH (ref 70–99)
Potassium: 3.9 mmol/L (ref 3.5–5.1)
Sodium: 139 mmol/L (ref 135–145)

## 2024-04-05 LAB — RESP PANEL BY RT-PCR (RSV, FLU A&B, COVID)  RVPGX2
Influenza A by PCR: NEGATIVE
Influenza B by PCR: NEGATIVE
Resp Syncytial Virus by PCR: NEGATIVE
SARS Coronavirus 2 by RT PCR: NEGATIVE

## 2024-04-05 LAB — CBC
HCT: 48.6 % (ref 39.0–52.0)
Hemoglobin: 16.6 g/dL (ref 13.0–17.0)
MCH: 29.5 pg (ref 26.0–34.0)
MCHC: 34.2 g/dL (ref 30.0–36.0)
MCV: 86.3 fL (ref 80.0–100.0)
Platelets: 235 K/uL (ref 150–400)
RBC: 5.63 MIL/uL (ref 4.22–5.81)
RDW: 14 % (ref 11.5–15.5)
WBC: 11.4 K/uL — ABNORMAL HIGH (ref 4.0–10.5)
nRBC: 0 % (ref 0.0–0.2)

## 2024-04-05 LAB — TROPONIN T, HIGH SENSITIVITY
Troponin T High Sensitivity: 42 ng/L — ABNORMAL HIGH (ref 0–19)
Troponin T High Sensitivity: 35 ng/L — ABNORMAL HIGH (ref 0–19)

## 2024-04-05 LAB — MAGNESIUM: Magnesium: 1.9 mg/dL (ref 1.7–2.4)

## 2024-04-05 MED ORDER — GADOBUTROL 1 MMOL/ML IV SOLN
9.0000 mL | Freq: Once | INTRAVENOUS | Status: AC | PRN
  Administered 2024-04-05: 9 mL via INTRAVENOUS

## 2024-04-05 MED ORDER — DIAZEPAM 2 MG PO TABS
2.0000 mg | ORAL_TABLET | Freq: Once | ORAL | Status: AC
  Administered 2024-04-05: 2 mg via ORAL
  Filled 2024-04-05: qty 1

## 2024-04-05 MED ORDER — METHOCARBAMOL 500 MG PO TABS: 500.0000 mg | ORAL_TABLET | Freq: Three times a day (TID) | ORAL | Status: DC | PRN

## 2024-04-05 MED ORDER — HYDROMORPHONE HCL 1 MG/ML IJ SOLN
0.5000 mg | Freq: Once | INTRAMUSCULAR | Status: AC
  Administered 2024-04-05: 0.5 mg via INTRAVENOUS
  Filled 2024-04-05: qty 0.5

## 2024-04-05 MED ORDER — OXYCODONE-ACETAMINOPHEN 5-325 MG PO TABS
2.0000 | ORAL_TABLET | Freq: Once | ORAL | Status: AC
  Administered 2024-04-05: 2 via ORAL
  Filled 2024-04-05: qty 2

## 2024-04-05 MED ORDER — ACETAMINOPHEN 325 MG PO TABS
650.0000 mg | ORAL_TABLET | Freq: Once | ORAL | Status: AC
  Administered 2024-04-05: 650 mg via ORAL
  Filled 2024-04-05: qty 2

## 2024-04-05 MED ORDER — IOHEXOL 300 MG/ML  SOLN
75.0000 mL | Freq: Once | INTRAMUSCULAR | Status: AC | PRN
  Administered 2024-04-05: 75 mL via INTRAVENOUS

## 2024-04-05 MED ORDER — OXYCODONE HCL 5 MG PO TABS
5.0000 mg | ORAL_TABLET | ORAL | Status: DC | PRN
  Administered 2024-04-06 – 2024-04-08 (×6): 5 mg via ORAL
  Filled 2024-04-05 (×6): qty 1

## 2024-04-05 MED ORDER — SODIUM CHLORIDE 0.9 % IV SOLN
1.0000 g | Freq: Once | INTRAVENOUS | Status: AC
  Administered 2024-04-05: 1 g via INTRAVENOUS
  Filled 2024-04-05: qty 10

## 2024-04-05 MED ORDER — VANCOMYCIN HCL IN DEXTROSE 1-5 GM/200ML-% IV SOLN
1000.0000 mg | Freq: Once | INTRAVENOUS | Status: AC
  Administered 2024-04-05: 1000 mg via INTRAVENOUS
  Filled 2024-04-05: qty 200

## 2024-04-05 MED ORDER — ONDANSETRON HCL 4 MG/2ML IJ SOLN
4.0000 mg | Freq: Once | INTRAMUSCULAR | Status: AC
Start: 2024-04-05 — End: 2024-04-05
  Administered 2024-04-05: 4 mg via INTRAVENOUS
  Filled 2024-04-05: qty 2

## 2024-04-05 NOTE — ED Notes (Signed)
  at bedside

## 2024-04-05 NOTE — Progress Notes (Signed)
 Patient ID: Connor [name], male   DOB: 04-Aug-1945, 78 y.o.   MRN: [id number] BP (!) 140/60   Pulse 91   Temp 99 F (37.2 C) (Oral)   Resp 15   Ht 5' 11 (1.803 m)   Wt 91.4 kg   SpO2 97%   BMI 28.10 kg/m  CT cervical spine and MRI cervical spine reviewed. 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 I have never witnessed a fused segment which has discitis since there is no longer a disc. There is no osteomyelitis, no discitis, no prevertebral swelling, no cord changes, no epidural processes. There are no neurological surgical issues.

## 2024-04-05 NOTE — ED Notes (Signed)
 Niece updated on admit status per her request

## 2024-04-05 NOTE — H&P (Incomplete)
 History and Physical    PatientBETHA DONEL [name] FMW:[id number] DOB: Aug 15, 1945 DOA: 04/05/2024 DOS: the patient was seen and examined on 04/05/2024 PCP: Sheryle Carwin, MD  Patient coming from: Home  Chief Complaint:  Chief Complaint  Patient presents with  . Torticollis   HPI: Connor [name] is a 78 y.o. male with medical history significant of hypertension, hyperlipidemia, atrial fibrillation on Eliquis who presents to the emergency department for evaluation of 2-day onset of neck pain.  He complained of waking up on Wednesday morning with neck pain described as  a crick .  Neck pain has since worsened bilaterally, but worse on the right.  Pain was rated as 10/10 on pain scale with no position of comfort.  Pain is aggravated with turning of neck/changing position.  Patient called his niece and asked her to take him to the ED for evaluation.  He denies fever, any known injury, numbness or tingling of upper extremities bilaterally.  ED course In the emergency department, he was tachycardic, BP was 147/84, other vital signs were within normal range.  Workup in the ED showed normal CBC except for WBC of 11.4, normal BMP except for blood glucose of 162, creatinine 1.39 (this was 1.50 on 09/26/2023).  Troponin 42 > 35, urinalysis was normal.  Magnesium 1.9, influenza A, B, SARS coronavirus 2, RSV was negative. Blood culture was pending. MRI cervical spine with and without contrast showed small amount of fluid  surrounding the C1-C2 articulation, likely degenerative/inflammatory.  Multilevel degenerative changes with severe left foraminal stenosis at C3-C4 and severe right foraminal stenosis at C5-C6. CT neck with contrast showed no acute findings or discrete mass in the neck CT cervical spine without contrast showed no acute cervical spine fracture, but showed multilevel cervical degenerative changes, most pronounced at C3-4, C4-5, and C5-6. Patient was empirically treated with IV vancomycin and  ceftriaxone for presumed osteomyelitis.  Neurosurgery Dr. Gillie was consulted and ruled out osteomyelitis, discitis, prevertebral swelling, any cord changes or any epidural processes and states that there are no neurological surgical lesions. Pain medication, Zofran  was given.   Review of Systems: As mentioned in the history of present illness. All other systems reviewed and are negative. Past Medical History:  Diagnosis Date  . Anxiety   . BPH (benign prostatic hyperplasia)   . Coronary artery disease   . GERD (gastroesophageal reflux disease)   . High cholesterol   . Hypercholesteremia   . Hypertension    Past Surgical History:  Procedure Laterality Date  . CARDIAC CATHETERIZATION     stent inserted  . CARDIAC SURGERY    . CATARACT EXTRACTION W/PHACO Right 01/19/2024   Procedure: PHACOEMULSIFICATION, CATARACT, WITH IOL INSERTION;  Surgeon: Harrie Lynwood, MD;  Location: [facility];  Service: Ophthalmology;  Laterality: Right;  CDE: 31.42  . CATARACT EXTRACTION W/PHACO Left 02/09/2024   Procedure: PHACOEMULSIFICATION, CATARACT, WITH IOL INSERTION;  Surgeon: Harrie Lynwood, MD;  Location: [facility];  Service: Ophthalmology;  Laterality: Left;  CDE: 7.50  . COLONOSCOPY  10/19/2006   MFM:[id number] friable anal canal with normal rectum. tubular adenoma -cecum  . COLONOSCOPY N/A 04/03/2015   Procedure: COLONOSCOPY;  Surgeon: Lamar CHRISTELLA Hollingshead, MD;  Location: [facility];  Service: Endoscopy;  Laterality: N/A;  1245  . HERNIA REPAIR Right   . TRANSURETHRAL RESECTION OF PROSTATE N/A 01/29/2013   Procedure: TRANSURETHRAL RESECTION OF THE PROSTATE (TURP);  Surgeon: Mohammad I Javaid, MD;  Location: [facility];  Service: Urology;  Laterality: N/A;   Social  History:  reports that he quit smoking about 25 years ago. His smoking use included cigarettes. He started smoking about 65 years ago. He has a 80 pack-year smoking history. He has never used smokeless tobacco. He reports that he does not currently use  alcohol. He reports that he does not use drugs.  Allergies  Allergen Reactions  . Demerol [Meperidine] Diarrhea and Nausea And Vomiting    Family History  Problem Relation Age of Onset  . Diabetes Mother   . Diabetes Father   . Heart attack Father   . Diabetes Sister   . Diabetes Brother   . Heart attack Brother   . Kidney disease Sister   . Diabetes Brother   . Colon cancer Neg Hx   . Prostate cancer Brother   . Cancer Brother        ?pancreatic    Prior to Admission medications   Medication Sig Start Date End Date Taking? Authorizing Provider  acetaminophen  (TYLENOL ) 500 MG tablet Take 1,000 mg by mouth every 6 (six) hours as needed.    [provider]  apixaban (ELIQUIS) 5 MG TABS tablet Take 5 mg by mouth 2 (two) times daily.    [provider]  cyclobenzaprine (FLEXERIL) 10 MG tablet Take 10 mg by mouth 2 (two) times daily as needed for muscle spasms.    [provider]  lisinopril (ZESTRIL) 20 MG tablet Take 20 mg by mouth daily.    [provider]  LORazepam  (ATIVAN ) 1 MG tablet Take 1 mg by mouth every 8 (eight) hours.    [provider]  metoprolol  (LOPRESSOR ) 100 MG tablet Take 100 mg by mouth daily.    [provider]  nitroGLYCERIN  (NITROSTAT ) 0.4 MG SL tablet Place 0.4 mg under the tongue every 5 (five) minutes as needed for chest pain. Last dose 6 months ago per pt 03/30/2015    [provider]  polyethylene glycol-electrolytes (TRILYTE) 420 G solution Take 4,000 mLs by mouth as directed. 03/30/15   Rourk, Lamar HERO, MD  ranitidine (ZANTAC) 150 MG tablet Take 150 mg by mouth daily as needed for heartburn.    [provider]  simvastatin  (ZOCOR ) 40 MG tablet Take 40 mg by mouth daily.    [provider]  sulfamethoxazole -trimethoprim  (BACTRIM  DS) 800-160 MG tablet Take 1 tablet by mouth every 12 (twelve) hours. 04/06/21   Stoneking, Adine PARAS., MD  tamsulosin  (FLOMAX ) 0.4 MG CAPS capsule  Take 1 capsule (0.4 mg total) by mouth daily. 04/06/21   Stoneking, Adine PARAS., MD  trandolapril  (MAVIK ) 2 MG tablet Take 2 mg by mouth daily.    [provider]    Physical Exam: Vitals:   04/05/24 1900 04/05/24 1930 04/05/24 2030 04/05/24 2113  BP: 120/68 119/73 (!) 140/60   Pulse: 90 83 91   Resp: 11 13 15    Temp:    99 F (37.2 C)  TempSrc:    Oral  SpO2: 95% 94% 97%   Weight:      Height:       General: Elderly male. Awake and alert and oriented x3. Not in any acute distress.  HEENT: NCAT.  PERRLA. EOMI. Sclerae anicteric.  Moist mucosal membranes. Neck: Bilateral spasm on palpation of neck (R > L ).  No carotid bruits. No masses palpated.  Cardiovascular: Regular rate with normal S1-S2 sounds. No murmurs, rubs or gallops auscultated. No JVD.  Respiratory: Clear breath sounds.  No accessory muscle use. Abdomen: Soft, nontender, nondistended. Active  bowel sounds. No masses or hepatosplenomegaly  Skin: No rashes, lesions, or ulcerations.  Dry, warm to touch. Musculoskeletal:  2+ dorsalis pedis and radial pulses. Good ROM.  No contractures  Psychiatric: Intact judgment and insight.  Mood appropriate to current condition. Neurologic: No focal neurological deficits. Strength is 5/5 x 4.  CN II - XII grossly intact.  Data Reviewed: EKG personally reviewed showed atrial fibrillation with RVR  Assessment and Plan: Torticollis Continue oxycodone  5 mg every 4 hours as needed for moderate/severe pain Continue Flexeril as needed  Essential hypertension Continue Mixed hyperlipidemia  GERD   Advance Care Planning:   Code Status: Prior ***  Consults: ***  Family Communication: ***  Severity of Illness: {Observation/Inpatient:21159}  Author: Posey Maier, DO 04/05/2024 9:14 PM  For on call review [email or web address].

## 2024-04-05 NOTE — ED Provider Notes (Signed)
 [place] [facility]ERGENCY DEPARTMENT AT [facility] Provider Note   CSN: [id number] Arrival date & time: 04/05/24  1012     Patient presents with: Torticollis   Connor [name] is a 78 y.o. male who presents emergency department for chief complaint of neck pain.  Patient reports that he does not frequently have neck pain but he woke with central neck pain about 2 days ago which she described as a crick.  Reports that since that time he has had progressively worsening pain bilaterally right greater than left.  He states that he cannot find a position of relief and has severe pain with trying to turn his neck or change position.  His family member at bedside states that he never asked to come to the doctor so they knew that something was wrong.  He has not been unable to turn his neck due to pain.  He has a past medical history of atrial fibrillation he is on Eliquis and metoprolol  he also has a history of heart disease hypertension hypercholesterolemia and GERD.  He is compliant with his medications.  He used rubbing alcohol on his neck without relief of his pain.  He has not been taking any Tylenol .  He has no known injuries.  He denies any other neurologic abnormalities such as numbness tingling or weakness of the upper extremities.  Patient is unsure if he took his daily medication this morning.  His niece at bedside states that he does not complain so she was very concerned when he asked to come into the hospital.   HPI     Prior to Admission medications   Medication Sig Start Date End Date Taking? Authorizing Provider  acetaminophen  (TYLENOL ) 500 MG tablet Take 1,000 mg by mouth every 6 (six) hours as needed.    [provider]  apixaban (ELIQUIS) 5 MG TABS tablet Take 5 mg by mouth 2 (two) times daily.    [provider]  cyclobenzaprine (FLEXERIL) 10 MG tablet Take 10 mg by mouth 2 (two) times daily as needed for muscle spasms.    [provider]   lisinopril (ZESTRIL) 20 MG tablet Take 20 mg by mouth daily.    [provider]  LORazepam  (ATIVAN ) 1 MG tablet Take 1 mg by mouth every 8 (eight) hours.    [provider]  metoprolol  (LOPRESSOR ) 100 MG tablet Take 100 mg by mouth daily.    [provider]  nitroGLYCERIN  (NITROSTAT ) 0.4 MG SL tablet Place 0.4 mg under the tongue every 5 (five) minutes as needed for chest pain. Last dose 6 months ago per pt 03/30/2015    [provider]  polyethylene glycol-electrolytes (TRILYTE) 420 G solution Take 4,000 mLs by mouth as directed. 03/30/15   Rourk, Lamar HERO, MD  ranitidine (ZANTAC) 150 MG tablet Take 150 mg by mouth daily as needed for heartburn.    [provider]  simvastatin  (ZOCOR ) 40 MG tablet Take 40 mg by mouth daily.    [provider]  sulfamethoxazole -trimethoprim  (BACTRIM  DS) 800-160 MG tablet Take 1 tablet by mouth every 12 (twelve) hours. 04/06/21   Stoneking, Adine PARAS., MD  tamsulosin  (FLOMAX ) 0.4 MG CAPS capsule Take 1 capsule (0.4 mg total) by mouth daily. 04/06/21   Stoneking, Adine PARAS., MD  trandolapril  (MAVIK ) 2 MG tablet Take 2 mg by mouth daily.    [provider]    Allergies: Demerol [meperidine]    Review of Systems  Updated Vital Signs BP (!) 147/84 (  BP Location: Right Arm)   Pulse (!) 109   Temp 98 F (36.7 C) (Oral)   Resp 18   Ht 5' 11 (1.803 m)   Wt 91.4 kg   SpO2 97%   BMI 28.10 kg/m   Physical Exam Vitals and nursing note reviewed.  Constitutional:      General: He is not in acute distress.    Appearance: He is well-developed. He is not diaphoretic.  HENT:     Head: Normocephalic and atraumatic.  Eyes:     General: No scleral icterus.    Conjunctiva/sclera: Conjunctivae normal.  Neck:     Comments: Acute spasm with rigidity and guarding. No nuchal rigidity or no meningismus.  His range of motion is limited.  Palpable spasm bilaterally right greater than left in the origin of the SCM  on the right and the levator scapula.  Equal bilateral grip strengths without weakness, 2+ palpable radial pulses are equal bilaterally.  Normal sensation Cardiovascular:     Rate and Rhythm: Normal rate and regular rhythm.     Heart sounds: Normal heart sounds.  Pulmonary:     Effort: Pulmonary effort is normal. No respiratory distress.     Breath sounds: Normal breath sounds.  Abdominal:     Palpations: Abdomen is soft.     Tenderness: There is no abdominal tenderness.  Musculoskeletal:     Cervical back: Rigidity present.  Skin:    General: Skin is warm and dry.  Neurological:     Mental Status: He is alert.  Psychiatric:        Behavior: Behavior normal.     (all labs ordered are listed, but only abnormal results are displayed) Labs Reviewed  CBC - Abnormal; Notable for the following components:      Result Value   WBC 11.4 (*)    All other components within normal limits  BASIC METABOLIC PANEL WITH GFR  MAGNESIUM  TROPONIN T, HIGH SENSITIVITY    EKG: EKG Interpretation Date/Time:  Friday April 05 2024 10:43:13 EDT Ventricular Rate:  123 PR Interval:    QRS Duration:  83 QT Interval:  319 QTC Calculation: 457 R Axis:   71  Text Interpretation: Atrial flutter Ventricular premature complex RSR' in V1 or V2, probably normal variant Repol abnrm suggests ischemia, inferior leads Confirmed by Patsey Lot [phone]) on 04/05/2024 11:09:22 AM  Radiology: No results found.   .Critical Care  Performed by: Arloa Chroman, PA-C Authorized by: Arloa Chroman, PA-C   Critical care provider statement:    Critical care time (minutes):  60   Critical care time was exclusive of:  Separately billable procedures and treating other patients and teaching time   Critical care was necessary to treat or prevent imminent or life-threatening deterioration of the following conditions: Acute discitis.   Critical care was time spent personally by me on the following activities:   Development of treatment plan with patient or surrogate, discussions with consultants, evaluation of patient's response to treatment, examination of patient, ordering and review of laboratory studies, ordering and review of radiographic studies, ordering and performing treatments and interventions, pulse oximetry, re-evaluation of patient's condition, review of old charts, interpretation of cardiac output measurements and obtaining history from patient or surrogate    Medications Ordered in the ED - No data to display  Clinical Course as of 04/06/24 1252  Fri Apr 05, 2024  1344 Creatinine(!): 1.39 [[place]]  1353 WBC(!): 11.4 [AH]  1440 Patient reevaluated at bedside in shared visit  with Dr. Soyla Body.  Although he reports that his pain has eased significantly he is still unable to range his neck in any direction more than about 5 degrees without severe pain and tension and spasm in the muscles.  Given his fever, A-fib with RVR elevated white blood cell count will proceed to look for deep space infection with CT soft tissue's of the neck and MRI. [AH]  1506 Patient's CT scan I visualized and independently interpreted shows multiple loss of disc height and osteophyte formations.  There also appears to be loss of normal lordotic curve which sometimes suggestive of spasm.  Agree with radiologic interpretation.  [AH]  1506 DG Chest Port 1 View July's and interpreted 1 view chest x-ray which shows no acute findings. [AH]  1506 Patient developed of borderline fever here in the emergency department with continued tachycardia.  I have ordered [AH]  1506  Tylenol  for treatment of the fever.  I have added on a urine as well as a respiratory panel.  Patient's troponin is somewhat elevated in the setting of A-fib with intermittent rapid ventricular rate and fever feel this is likely due to demand he will require a repeat troponin. [AH]  2023 I visualized interpreted and reviewed CT soft tissue neck images and MR  cervical spine with findings in the EMR concerning for potential discitis.  CT images without acute finding.  I have given signout to PA Rigney who will admit the patient.  He will also consult with neurosurgery.  Patient's troponin downtrending.  Pain is better controlled.  Heart rate within normal limits after fluid resuscitation.  He will also likely need antibiotic coverage.  Patient and his niece updated at bedside. [AH]    Clinical Course User Index [AH] Eluzer Howdeshell, PA-C [[place]] Jaffal, Nadia, Student-PA                                 Medical Decision Making Amount and/or Complexity of Data Reviewed Labs: ordered. Decision-making details documented in ED Course. Radiology: ordered. Decision-making details documented in ED Course.  Risk OTC drugs. Prescription drug management. Decision regarding hospitalization.   This patient presents to the ED for concern of neck pain, this involves an extensive number of treatment options, and is a complaint that carries with it a high risk of complications and morbidity.  Differential diagnosis includes arthritis, acute spastic torticollis, meningitis, discitis, osteomyelitis.  I have low suspicion for meningitis given his lack of headache.  Co morbidities:   has a past medical history of Anxiety, BPH (benign prostatic hyperplasia), Coronary artery disease, GERD (gastroesophageal reflux disease), High cholesterol, Hypercholesteremia, and Hypertension.   Social Determinants of Health:   SDOH Screenings   Food Insecurity: No Food Insecurity (04/05/2024)  Housing: Low Risk  (04/05/2024)  Transportation Needs: No Transportation Needs (04/05/2024)  Utilities: Not At Risk (04/05/2024)  Social Connections: Unknown (04/05/2024)  Tobacco Use: Medium Risk (04/05/2024)     Additional history:  {Additional history obtained from the's at bedside   Lab Tests:  I Ordered, and personally interpreted labs.  The pertinent results include:    As per ED course   Imaging Studies: As per ED course  Cardiac Monitoring/ECG:  The patient was maintained on a cardiac monitor.  I personally viewed and interpreted the cardiac monitored which showed an underlying rhythm of: A-fib sometimes rate controlled sometimes with RVR  Medicines ordered and prescription drug management:  I ordered medication  including  Medications  oxyCODONE  (Oxy IR/ROXICODONE ) immediate release tablet 5 mg (5 mg Oral Given 04/06/24 1054)  lisinopril (ZESTRIL) tablet 20 mg (20 mg Oral Given 04/06/24 1037)  metoprolol  tartrate (LOPRESSOR ) tablet 100 mg (100 mg Oral Given 04/06/24 1036)  simvastatin  (ZOCOR ) tablet 40 mg (40 mg Oral Given 04/06/24 1038)  apixaban (ELIQUIS) tablet 5 mg (5 mg Oral Given 04/06/24 1039)  cyclobenzaprine (FLEXERIL) tablet 10 mg (10 mg Oral Given 04/06/24 0028)  pantoprazole (PROTONIX) EC tablet 40 mg (40 mg Oral Given 04/06/24 1039)  acetaminophen  (TYLENOL ) tablet 650 mg (has no administration in time range)    Or  acetaminophen  (TYLENOL ) suppository 650 mg (has no administration in time range)  ondansetron  (ZOFRAN ) tablet 4 mg (has no administration in time range)    Or  ondansetron  (ZOFRAN ) injection 4 mg (has no administration in time range)  tamsulosin  (FLOMAX ) capsule 0.4 mg (0.4 mg Oral Given 04/06/24 1038)  insulin aspart (novoLOG) injection 0-6 Units (1 Units Subcutaneous Given 04/06/24 1200)  methylPREDNISolone sodium succinate (SOLU-MEDROL) 40 mg/mL injection 40 mg (40 mg Intravenous Given 04/06/24 1054)  tiZANidine (ZANAFLEX) tablet 4 mg (has no administration in time range)  oxyCODONE -acetaminophen  (PERCOCET/ROXICET) 5-325 MG per tablet 2 tablet (2 tablets Oral Given 04/05/24 1300)  diazepam (VALIUM) tablet 2 mg (2 mg Oral Given 04/05/24 1300)  acetaminophen  (TYLENOL ) tablet 650 mg (650 mg Oral Given 04/05/24 1445)  HYDROmorphone  (DILAUDID ) injection 0.5 mg (0.5 mg Intravenous Given 04/05/24 1642)  ondansetron  (ZOFRAN ) injection  4 mg (4 mg Intravenous Given 04/05/24 1642)  iohexol  (OMNIPAQUE ) 300 MG/ML solution 75 mL (75 mLs Intravenous Contrast Given 04/05/24 1822)  gadobutrol (GADAVIST) 1 MMOL/ML injection 9 mL (9 mLs Intravenous Contrast Given 04/05/24 1759)  vancomycin (VANCOCIN) IVPB 1000 mg/200 mL premix (0 mg Intravenous Stopped 04/05/24 2221)  cefTRIAXone (ROCEPHIN) 1 g in sodium chloride  0.9 % 100 mL IVPB (0 g Intravenous Stopped 04/05/24 2146)   for pain fever control and infection Reevaluation of the patient after these medicines showed that the patient improved I have reviewed the patients home medicines and have made adjustments as needed      ICD-10-CM   1. Discitis of cervical region  M46.42       MDM: As per ED course        Final diagnoses:  Discitis of cervical region    ED Discharge Orders          Ordered    Amb referral to [facility]        04/05/24 1109               Arloa Chroman, PA-C 04/06/24 1255    Patsey Lot, MD 04/06/24 1456

## 2024-04-05 NOTE — ED Notes (Signed)
 Pts wife was standing outside of the room and called nurse over. This nurse came over to assess their needs. Wife stated she would like an update on pts status. This nurse told her she would make the provider aware and asked if they needed anything else while they waited. Wife verbalized no.

## 2024-04-05 NOTE — H&P (Addendum)
 History and Physical    Patient: Connor [name] FMW:[id number] DOB: 08-31-1945 DOA: 04/05/2024 DOS: the patient was seen and examined on 04/05/2024 PCP: Sheryle Carwin, MD  Patient coming from: Home  Chief Complaint:  Chief Complaint  Patient presents with   Torticollis   HPI: Connor KRISHER is a 78 y.o. male with medical history significant of hypertension, hyperlipidemia, atrial fibrillation on Eliquis who presents to the emergency department for evaluation of 2-day onset of neck pain.  He complained of waking up on Wednesday morning with neck pain described as  a crick .  Neck pain has since worsened bilaterally, but worse on the right.  Pain was rated as 10/10 on pain scale with no position of comfort.  Pain is aggravated with turning of neck/changing position.  Patient called his niece and asked her to take him to the ED for evaluation.  He denies fever, any known injury, numbness or tingling of upper extremities bilaterally.  ED course In the emergency department, he was tachycardic, BP was 147/84, other vital signs were within normal range.  Workup in the ED showed normal CBC except for WBC of 11.4, normal BMP except for blood glucose of 162, creatinine 1.39 (this was 1.50 on 09/26/2023).  Troponin 42 > 35, urinalysis was normal.  Magnesium 1.9, influenza A, B, SARS coronavirus 2, RSV was negative. Blood culture was pending. MRI cervical spine with and without contrast showed small amount of fluid  surrounding the C1-C2 articulation, likely degenerative/inflammatory.  Multilevel degenerative changes with severe left foraminal stenosis at C3-C4 and severe right foraminal stenosis at C5-C6. CT neck with contrast showed no acute findings or discrete mass in the neck CT cervical spine without contrast showed no acute cervical spine fracture, but showed multilevel cervical degenerative changes, most pronounced at C3-4, C4-5, and C5-6. Patient was empirically treated with IV vancomycin and  ceftriaxone for presumed osteomyelitis.  Neurosurgery Dr. Gillie was consulted and ruled out osteomyelitis, discitis, prevertebral swelling, any cord changes or any epidural processes and states that there are no neurological surgical lesions. Pain medication, Zofran  was given.   Review of Systems: As mentioned in the history of present illness. All other systems reviewed and are negative. Past Medical History:  Diagnosis Date   Anxiety    BPH (benign prostatic hyperplasia)    Coronary artery disease    GERD (gastroesophageal reflux disease)    High cholesterol    Hypercholesteremia    Hypertension    Past Surgical History:  Procedure Laterality Date   CARDIAC CATHETERIZATION     stent inserted   CARDIAC SURGERY     CATARACT EXTRACTION W/PHACO Right 01/19/2024   Procedure: PHACOEMULSIFICATION, CATARACT, WITH IOL INSERTION;  Surgeon: Harrie Lynwood, MD;  Location: [facility];  Service: Ophthalmology;  Laterality: Right;  CDE: 31.42   CATARACT EXTRACTION W/PHACO Left 02/09/2024   Procedure: PHACOEMULSIFICATION, CATARACT, WITH IOL INSERTION;  Surgeon: Harrie Lynwood, MD;  Location: [facility];  Service: Ophthalmology;  Laterality: Left;  CDE: 7.50   COLONOSCOPY  10/19/2006   MFM:[id number] friable anal canal with normal rectum. tubular adenoma -cecum   COLONOSCOPY N/A 04/03/2015   Procedure: COLONOSCOPY;  Surgeon: Lamar CHRISTELLA Hollingshead, MD;  Location: [facility];  Service: Endoscopy;  Laterality: N/A;  1245   HERNIA REPAIR Right    TRANSURETHRAL RESECTION OF PROSTATE N/A 01/29/2013   Procedure: TRANSURETHRAL RESECTION OF THE PROSTATE (TURP);  Surgeon: Mohammad I Javaid, MD;  Location: [facility];  Service: Urology;  Laterality: N/A;   Social  History:  reports that he quit smoking about 25 years ago. His smoking use included cigarettes. He started smoking about 65 years ago. He has a 80 pack-year smoking history. He has never used smokeless tobacco. He reports that he does not currently use alcohol. He  reports that he does not use drugs.  Allergies  Allergen Reactions   Demerol [Meperidine] Diarrhea and Nausea And Vomiting    Family History  Problem Relation Age of Onset   Diabetes Mother    Diabetes Father    Heart attack Father    Diabetes Sister    Diabetes Brother    Heart attack Brother    Kidney disease Sister    Diabetes Brother    Colon cancer Neg Hx    Prostate cancer Brother    Cancer Brother        ?pancreatic    Prior to Admission medications   Medication Sig Start Date End Date Taking? Authorizing Provider  acetaminophen  (TYLENOL ) 500 MG tablet Take 1,000 mg by mouth every 6 (six) hours as needed.    [provider]  apixaban (ELIQUIS) 5 MG TABS tablet Take 5 mg by mouth 2 (two) times daily.    [provider]  cyclobenzaprine (FLEXERIL) 10 MG tablet Take 10 mg by mouth 2 (two) times daily as needed for muscle spasms.    [provider]  lisinopril (ZESTRIL) 20 MG tablet Take 20 mg by mouth daily.    [provider]  LORazepam  (ATIVAN ) 1 MG tablet Take 1 mg by mouth every 8 (eight) hours.    [provider]  metoprolol  (LOPRESSOR ) 100 MG tablet Take 100 mg by mouth daily.    [provider]  nitroGLYCERIN  (NITROSTAT ) 0.4 MG SL tablet Place 0.4 mg under the tongue every 5 (five) minutes as needed for chest pain. Last dose 6 months ago per pt 03/30/2015    [provider]  polyethylene glycol-electrolytes (TRILYTE) 420 G solution Take 4,000 mLs by mouth as directed. 03/30/15   Rourk, Lamar HERO, MD  ranitidine (ZANTAC) 150 MG tablet Take 150 mg by mouth daily as needed for heartburn.    [provider]  simvastatin  (ZOCOR ) 40 MG tablet Take 40 mg by mouth daily.    [provider]  sulfamethoxazole -trimethoprim  (BACTRIM  DS) 800-160 MG tablet Take 1 tablet by mouth every 12 (twelve) hours. 04/06/21   Stoneking, Adine PARAS., MD  tamsulosin  (FLOMAX ) 0.4 MG CAPS capsule Take 1 capsule (0.4 mg  total) by mouth daily. 04/06/21   Stoneking, Adine PARAS., MD  trandolapril  (MAVIK ) 2 MG tablet Take 2 mg by mouth daily.    [provider]    Physical Exam: Vitals:   04/05/24 1900 04/05/24 1930 04/05/24 2030 04/05/24 2113  BP: 120/68 119/73 (!) 140/60   Pulse: 90 83 91   Resp: 11 13 15    Temp:    99 F (37.2 C)  TempSrc:    Oral  SpO2: 95% 94% 97%   Weight:      Height:       General: Elderly male. Awake and alert and oriented x3. Not in any acute distress.  HEENT: NCAT.  PERRLA. EOMI. Sclerae anicteric.  Dry mucosal membranes. Neck: Bilateral spasm on palpation of neck (R > L ).  No carotid bruits. No masses palpated.  Cardiovascular: Irregular rate and rhythm. No murmurs, rubs or gallops auscultated. No JVD.  Respiratory: Clear breath sounds.  No accessory muscle use. Abdomen: Soft, nontender, nondistended. Active bowel sounds.  No masses or hepatosplenomegaly  Skin: No rashes, lesions, or ulcerations.  Dry, warm to touch. Musculoskeletal:  2+ dorsalis pedis and radial pulses. Good ROM.  No contractures  Psychiatric: Intact judgment and insight.  Mood appropriate to current condition. Neurologic: No focal neurological deficits. Strength is 5/5 x 4.  CN II - XII grossly intact.  Data Reviewed: EKG personally reviewed showed atrial fibrillation with RVR  Assessment and Plan: Torticollis Continue oxycodone  5 mg every 4 hours as needed for moderate/severe pain Continue Flexeril as needed  Elevated troponin possibly secondary to type II demand ischemia Troponin 42 >> 35; troponin flattened.  Patient denies any chest pain  Hyperglycemia possibly reactive CBG 162; Patient without history of T2DM Hemoglobin A1c will be checked  Elevated creatinine possibly due to AKI vs CKD Dehydration Creatinine 1.39 (this was 1.50 on 09/26/2023, more prior labs for comparison was 11 years ago). Continue gentle hydration Renally adjust medications, avoid nephrotoxic  agents/dehydration/hypotension  Essential hypertension Continue lisinopril, Lopressor   Mixed hyperlipidemia Continue Zocor   GERD Continue Protonix  Chronic atrial fibrillation with RVR Continue Lopressor , Eliquis  Urinary Retention Continue Flomax  Consider placement of Foley catheter if symptoms persist.  Patient states that he has had Foley due to similar problem in the past    Advance Care Planning: Full code  Consults: None  Family Communication: None at bedside  Severity of Illness: The appropriate patient status for this patient is OBSERVATION. Observation status is judged to be reasonable and necessary in order to provide the required intensity of service to ensure the patient's safety. The patient's presenting symptoms, physical exam findings, and initial radiographic and laboratory data in the context of their medical condition is felt to place them at decreased risk for further clinical deterioration. Furthermore, it is anticipated that the patient will be medically stable for discharge from the hospital within 2 midnights of admission.   Author: Esiquio Boesen, DO 04/05/2024 9:14 PM  For on call review [email or web address].

## 2024-04-05 NOTE — ED Triage Notes (Signed)
 Pt to the ED complaining of posterior neck pain that began on Tuesday.

## 2024-04-05 NOTE — ED Notes (Signed)
 Pt was offered a warm blanket at this time.

## 2024-04-05 NOTE — ED Provider Notes (Signed)
 Patient signed out to myself at shift change by Stefano Lesches.  Patient has MRI findings concerning for discitis.  Patient did have a leukocytosis and did spike a fever while in the emergency department.  Will need consult with neurosurgery. Physical Exam  BP (!) 140/60   Pulse 91   Temp 99 F (37.2 C) (Oral)   Resp 15   Ht 5' 11 (1.803 m)   Wt 91.4 kg   SpO2 97%   BMI 28.10 kg/m   Physical Exam  Procedures  Procedures  ED Course / MDM   Clinical Course as of 04/05/24 2113  Fri Apr 05, 2024  1344 Creatinine(!): 1.39 [[place]]  1353 WBC(!): 11.4 [AH]  1506 Patient's CT scan I visualized and independently interpreted shows multiple loss of disc height and osteophyte formations.  There also appears to be loss of normal lordotic curve which sometimes suggestive of spasm.  Agree with radiologic interpretation.  [AH]  1506 DG Chest Port 1 View July's and interpreted 1 view chest x-ray which shows no acute findings. [AH]  1506 Patient developed of borderline fever here in the emergency department with continued tachycardia.  I have ordered [AH]  1506  Tylenol  for treatment of the fever.  I have added on a urine as well as a respiratory panel.  Patient's troponin is somewhat elevated in the setting of A-fib with intermittent rapid ventricular rate and fever feel this is likely due to demand he will require a repeat troponin. [AH]    Clinical Course User Index [AH] Harris, Abigail, PA-C [[place]] Jaffal, Nadia, Student-PA   Medical Decision Making Amount and/or Complexity of Data Reviewed Labs: ordered. Decision-making details documented in ED Course. Radiology: ordered. Decision-making details documented in ED Course.  Risk OTC drugs. Prescription drug management. Decision regarding hospitalization.   Did discuss patient case with Dr. Gillie with neurosurgery who notes that it does not appear to be truly discitis at this time.  It is okay to be admitted to our facility.  Antibiotics have  been ordered.  Blood cultures ordered as well.  Have discussed patient case with Dr. Adefeso with the hospital service who has excepted for admission.       Daralene Lonni BIRCH, PA-C 04/05/24 2116    Francesca Elsie CROME, MD 04/05/24 2121

## 2024-04-06 DIAGNOSIS — M542 Cervicalgia: Secondary | ICD-10-CM | POA: Diagnosis not present

## 2024-04-06 LAB — CBC
HCT: 44.4 % (ref 39.0–52.0)
Hemoglobin: 14.4 g/dL (ref 13.0–17.0)
MCH: 29 pg (ref 26.0–34.0)
MCHC: 32.4 g/dL (ref 30.0–36.0)
MCV: 89.5 fL (ref 80.0–100.0)
Platelets: 211 K/uL (ref 150–400)
RBC: 4.96 MIL/uL (ref 4.22–5.81)
RDW: 13.8 % (ref 11.5–15.5)
WBC: 9.6 K/uL (ref 4.0–10.5)
nRBC: 0 % (ref 0.0–0.2)

## 2024-04-06 LAB — HEMOGLOBIN A1C
Hgb A1c MFr Bld: 5.9 % — ABNORMAL HIGH (ref 4.8–5.6)
Mean Plasma Glucose: 122.63 mg/dL

## 2024-04-06 LAB — COMPREHENSIVE METABOLIC PANEL WITH GFR
ALT: 12 U/L (ref 0–44)
AST: 12 U/L — ABNORMAL LOW (ref 15–41)
Albumin: 3.7 g/dL (ref 3.5–5.0)
Alkaline Phosphatase: 48 U/L (ref 38–126)
Anion gap: 10 (ref 5–15)
BUN: 18 mg/dL (ref 8–23)
CO2: 25 mmol/L (ref 22–32)
Calcium: 9 mg/dL (ref 8.9–10.3)
Chloride: 103 mmol/L (ref 98–111)
Creatinine, Ser: 1.22 mg/dL (ref 0.61–1.24)
GFR, Estimated: 60 mL/min — ABNORMAL LOW (ref >60)
Glucose, Bld: 110 mg/dL — ABNORMAL HIGH (ref 70–99)
Potassium: 4.3 mmol/L (ref 3.5–5.1)
Sodium: 138 mmol/L (ref 135–145)
Total Bilirubin: 1.7 mg/dL — ABNORMAL HIGH (ref 0.0–1.2)
Total Protein: 6.6 g/dL (ref 6.5–8.1)

## 2024-04-06 LAB — PHOSPHORUS: Phosphorus: 2.8 mg/dL (ref 2.5–4.6)

## 2024-04-06 LAB — GLUCOSE, CAPILLARY
Glucose-Capillary: 171 mg/dL — ABNORMAL HIGH (ref 70–99)
Glucose-Capillary: 246 mg/dL — ABNORMAL HIGH (ref 70–99)
Glucose-Capillary: 142 mg/dL — ABNORMAL HIGH (ref 70–99)
Glucose-Capillary: 178 mg/dL — ABNORMAL HIGH (ref 70–99)

## 2024-04-06 LAB — MAGNESIUM: Magnesium: 1.9 mg/dL (ref 1.7–2.4)

## 2024-04-06 MED ORDER — TIZANIDINE HCL 2 MG PO TABS
2.0000 mg | ORAL_TABLET | Freq: Three times a day (TID) | ORAL | Status: DC
  Administered 2024-04-06: 2 mg via ORAL
  Filled 2024-04-06: qty 1

## 2024-04-06 MED ORDER — ACETAMINOPHEN 325 MG PO TABS: 650.0000 mg | ORAL_TABLET | Freq: Four times a day (QID) | ORAL | Status: DC | PRN

## 2024-04-06 MED ORDER — SIMVASTATIN 20 MG PO TABS
40.0000 mg | ORAL_TABLET | Freq: Every day | ORAL | Status: DC
  Administered 2024-04-06 – 2024-04-09 (×4): 40 mg via ORAL
  Filled 2024-04-06 (×4): qty 2

## 2024-04-06 MED ORDER — METHYLPREDNISOLONE SODIUM SUCC 40 MG IJ SOLR
40.0000 mg | Freq: Every day | INTRAMUSCULAR | Status: DC
  Administered 2024-04-06: 40 mg via INTRAVENOUS
  Filled 2024-04-06: qty 1

## 2024-04-06 MED ORDER — INSULIN ASPART 100 UNIT/ML IJ SOLN
0.0000 [IU] | Freq: Three times a day (TID) | INTRAMUSCULAR | Status: DC
  Administered 2024-04-06: 1 [IU] via SUBCUTANEOUS
  Administered 2024-04-07: 2 [IU] via SUBCUTANEOUS
  Administered 2024-04-07 – 2024-04-08 (×3): 1 [IU] via SUBCUTANEOUS

## 2024-04-06 MED ORDER — TAMSULOSIN HCL 0.4 MG PO CAPS
0.4000 mg | ORAL_CAPSULE | Freq: Every day | ORAL | Status: DC
  Administered 2024-04-06 – 2024-04-09 (×4): 0.4 mg via ORAL
  Filled 2024-04-06 (×4): qty 1

## 2024-04-06 MED ORDER — SODIUM CHLORIDE 0.9 % IV SOLN: INTRAVENOUS | Status: DC

## 2024-04-06 MED ORDER — TIZANIDINE HCL 2 MG PO TABS: 2.0000 mg | ORAL_TABLET | Freq: Three times a day (TID) | ORAL | 0 refills | Status: AC

## 2024-04-06 MED ORDER — ONDANSETRON HCL 4 MG/2ML IJ SOLN: 4.0000 mg | Freq: Four times a day (QID) | INTRAMUSCULAR | Status: DC | PRN

## 2024-04-06 MED ORDER — LACTATED RINGERS IV SOLN: INTRAVENOUS | Status: DC

## 2024-04-06 MED ORDER — ONDANSETRON HCL 4 MG PO TABS
4.0000 mg | ORAL_TABLET | Freq: Four times a day (QID) | ORAL | Status: DC | PRN
Start: 2024-04-06 — End: 2024-04-09

## 2024-04-06 MED ORDER — NAPHAZOLINE-GLYCERIN 0.012-0.25 % OP SOLN: 1.0000 [drp] | Freq: Four times a day (QID) | OPHTHALMIC | Status: DC | PRN

## 2024-04-06 MED ORDER — METOPROLOL TARTRATE 50 MG PO TABS
100.0000 mg | ORAL_TABLET | Freq: Every day | ORAL | Status: DC
  Administered 2024-04-06: 100 mg via ORAL
  Filled 2024-04-06: qty 2

## 2024-04-06 MED ORDER — VANCOMYCIN HCL 2000 MG/400ML IV SOLN
2000.0000 mg | Freq: Once | INTRAVENOUS | Status: AC
  Administered 2024-04-06: 2000 mg via INTRAVENOUS
  Filled 2024-04-06 (×2): qty 400

## 2024-04-06 MED ORDER — APIXABAN 5 MG PO TABS
5.0000 mg | ORAL_TABLET | Freq: Two times a day (BID) | ORAL | Status: DC
  Administered 2024-04-06 – 2024-04-09 (×8): 5 mg via ORAL
  Filled 2024-04-06 (×8): qty 1

## 2024-04-06 MED ORDER — ACETAMINOPHEN 650 MG RE SUPP: 650.0000 mg | Freq: Four times a day (QID) | RECTAL | Status: DC | PRN

## 2024-04-06 MED ORDER — METOPROLOL TARTRATE 25 MG PO TABS
12.5000 mg | ORAL_TABLET | Freq: Every day | ORAL | Status: DC
  Administered 2024-04-07 – 2024-04-09 (×3): 12.5 mg via ORAL
  Filled 2024-04-06 (×3): qty 1

## 2024-04-06 MED ORDER — MELATONIN 3 MG PO TABS
6.0000 mg | ORAL_TABLET | Freq: Every evening | ORAL | Status: DC | PRN
  Administered 2024-04-06 – 2024-04-07 (×2): 6 mg via ORAL
  Filled 2024-04-06 (×2): qty 2

## 2024-04-06 MED ORDER — TIZANIDINE HCL 4 MG PO TABS
4.0000 mg | ORAL_TABLET | Freq: Three times a day (TID) | ORAL | Status: DC
Start: 2024-04-06 — End: 2024-04-09
  Administered 2024-04-06 – 2024-04-09 (×9): 4 mg via ORAL
  Filled 2024-04-06: qty 2
  Filled 2024-04-06 (×8): qty 1

## 2024-04-06 MED ORDER — OXYCODONE HCL 5 MG PO TABS: 5.0000 mg | ORAL_TABLET | ORAL | 0 refills | Status: AC | PRN

## 2024-04-06 MED ORDER — METHYLPREDNISOLONE 4 MG PO TBPK: ORAL_TABLET | ORAL | 0 refills | Status: AC

## 2024-04-06 MED ORDER — POLYVINYL ALCOHOL 1.4 % OP SOLN: 1.0000 [drp] | Freq: Four times a day (QID) | OPHTHALMIC | Status: DC | PRN

## 2024-04-06 MED ORDER — LISINOPRIL 10 MG PO TABS
20.0000 mg | ORAL_TABLET | Freq: Every day | ORAL | Status: DC
  Administered 2024-04-06: 20 mg via ORAL
  Filled 2024-04-06: qty 2

## 2024-04-06 MED ORDER — VANCOMYCIN HCL 1500 MG/300ML IV SOLN: 1500.0000 mg | INTRAVENOUS | Status: DC

## 2024-04-06 MED ORDER — CYCLOBENZAPRINE HCL 10 MG PO TABS
10.0000 mg | ORAL_TABLET | Freq: Two times a day (BID) | ORAL | Status: DC | PRN
  Administered 2024-04-06 – 2024-04-07 (×2): 10 mg via ORAL
  Filled 2024-04-06 (×3): qty 1

## 2024-04-06 MED ORDER — VANCOMYCIN HCL IN DEXTROSE 1-5 GM/200ML-% IV SOLN: 1000.0000 mg | INTRAVENOUS | Status: DC

## 2024-04-06 MED ORDER — PANTOPRAZOLE SODIUM 40 MG PO TBEC
40.0000 mg | DELAYED_RELEASE_TABLET | Freq: Every day | ORAL | Status: DC
  Administered 2024-04-06 – 2024-04-09 (×4): 40 mg via ORAL
  Filled 2024-04-06 (×4): qty 1

## 2024-04-06 NOTE — Hospital Course (Addendum)
 Connor [name] is a 78 y.o. male with medical history significant of hypertension, hyperlipidemia, atrial fibrillation on Eliquis who presents to the emergency department for evaluation of 2-day onset of neck pain.  He complained of waking up on Wednesday morning with neck pain described as  a crick .  Neck pain has since worsened bilaterally, but worse on the right.  Pain was rated as 10/10 on pain scale with no position of comfort.  Pain is aggravated with turning of neck/changing position.  Patient called his niece and asked her to take him to the ED for evaluation.  He denies fever, any known injury, numbness or tingling of upper extremities bilaterally.    ED course; He was tachycardic, BP was 147/84, other vital signs were within normal range.  WBC of 11.4, normal BMP except for blood glucose of 162, Cr. 1.39 (this was 1.50 on 09/26/2023).  Troponin 42 > 35, urinalysis was normal.  Magnesium 1.9, influenza A, B, SARS coronavirus 2, RSV was negative. Blood culture was pending.  MRI cervical spine with and without contrast showed small amount of fluid  surrounding the C1-C2 articulation, likely degenerative/inflammatory.  Multilevel degenerative changes with severe left foraminal stenosis at C3-C4 and severe right foraminal stenosis at C5-C6. CT neck with contrast showed no acute findings or discrete mass in the neck CT cervical spine without contrast showed no acute cervical spine fracture, but showed multilevel cervical degenerative changes, most pronounced at C3-4, C4-5, and C5-6. Patient was empirically treated with IV vancomycin and ceftriaxone for presumed osteomyelitis.  Neurosurgery Dr. Gillie was consulted and ruled out osteomyelitis, discitis, prevertebral swelling, any cord changes or any epidural processes and states that there are no neurological surgical lesions. Pain medication, Zofran  was given.   Assessment & Plan:   Principal Problem:   Neck pain   Assessment and  Plan: Torticollis Continue oxycodone  5 mg every 4 hours as needed for moderate/severe pain Continue Flexeril as needed   Elevated troponin possibly secondary to type II demand ischemia Troponin 42 >> 35; troponin flattened.  Patient denies any chest pain   Hyperglycemia possibly reactive CBG 162; Patient without history of T2DM Hemoglobin A1c:    Elevated creatinine possibly due to AKI vs CKD Dehydration Creatinine 1.39 (this was 1.50 on 09/26/2023, more prior labs for comparison was 11 years ago). Continue gentle hydration Renally adjust medications, avoid nephrotoxic agents/dehydration/hypotension   Essential hypertension Continue lisinopril, Lopressor    Mixed hyperlipidemia Continue Zocor    GERD Continue Protonix   Chronic atrial fibrillation with RVR Continue Lopressor , Eliquis   Urinary Retention Continue Flomax  Consider placement of Foley catheter if symptoms persist.  Patient states that he has had Foley due to similar problem in the past

## 2024-04-06 NOTE — Care Management Obs Status (Signed)
 MEDICARE OBSERVATION STATUS NOTIFICATION   Patient Details  Name: Connor [name] MRN: [id number] Date of Birth: Aug 07, 1945   Medicare Observation Status Notification Given:  Yes    Sharlyne Stabs, RN 04/06/2024, 1:12 PM

## 2024-04-06 NOTE — Plan of Care (Signed)
  Problem: Acute Rehab PT Goals(only PT should resolve) Goal: Pt Will Go Supine/Side To Sit Outcome: Progressing Flowsheets (Taken 04/06/2024 1039) Pt will go Supine/Side to Sit: with modified independence Goal: Pt Will Go Sit To Supine/Side Outcome: Progressing Flowsheets (Taken 04/06/2024 1039) Pt will go Sit to Supine/Side: Independently Goal: Patient Will Transfer Sit To/From Stand Outcome: Progressing Flowsheets (Taken 04/06/2024 1039) Patient will transfer sit to/from stand: with modified independence Goal: Pt Will Ambulate Outcome: Progressing Flowsheets (Taken 04/06/2024 1039) Pt will Ambulate:  Independently  100 feet  with least restrictive assistive device

## 2024-04-06 NOTE — Evaluation (Signed)
 Physical Therapy Evaluation Patient Details Name: Connor [name] MRN: [id number] DOB: November 18, 1945 Today's Date: 04/06/2024  History of Present Illness  Connor [name] is a 78 y.o. male with medical history significant of hypertension, hyperlipidemia, atrial fibrillation on Eliquis who presents to the emergency department for evaluation of 2-day onset of neck pain.  He complained of waking up on Wednesday morning with neck pain described as  a crick .  Neck pain has since worsened bilaterally, but worse on the right.  Pain was rated as 10/10 on pain scale with no position of comfort.  Pain is aggravated with turning of neck/changing position.  Patient called his niece and asked her to take him to the ED for evaluation.  He denies fever, any known injury, numbness or tingling of upper extremities bilaterally.   Clinical Impression  On therapist arrival, patient is sitting up on the edge of the bed; his niece is present at bedside.  Patient reports 8/10 neck pain and demonstrates guarding of the neck with all movement.  Patient performs sit to supine with bed flat with slow guarded movement and Stand by assist. Sit to supine requires min A from therapist to pull back up to sitting.  Patient demonstrates good sitting balance on the edge of the bed.  Sit to stand to RW with CGA only for safety and patient is able to walk 30 ft with RW and SBA/CGA decreased gait speed and guarding of neck movement.  He returns to bed and sits on the edge of the bed with his breakfast tray.  patient left in bed with call button in reach,  and nursing aware of mobility status.  Patient will benefit from continued skilled therapy services to address deficits and promote return to optimal function.             If plan is discharge home, recommend the following: A little help with walking and/or transfers;A little help with bathing/dressing/bathroom;Help with stairs or ramp for entrance   Can travel by private vehicle         Equipment Recommendations    Recommendations for Other Services       Functional Status Assessment Patient has had a recent decline in their functional status and demonstrates the ability to make significant improvements in function in a reasonable and predictable amount of time.     Precautions / Restrictions Precautions Precautions: None Restrictions Weight Bearing Restrictions Per Provider Order: No      Mobility  Bed Mobility Overal bed mobility: Needs Assistance Bed Mobility: Supine to Sit, Sit to Supine     Supine to sit: Min assist Sit to supine: Supervision   General bed mobility comments: needed min A to pull up to sitting from lying flat due to neck pain Patient Response: Cooperative  Transfers Overall transfer level: Needs assistance Equipment used: Rolling walker (2 wheels) Transfers: Sit to/from Stand Sit to Stand: Contact guard assist           General transfer comment: slow guarded movement    Ambulation/Gait Ambulation/Gait assistance: Contact guard assist, Supervision Gait Distance (Feet): 30 Feet Assistive device: Rolling walker (2 wheels)   Gait velocity: decreased        Stairs            Wheelchair Mobility     Tilt Bed Tilt Bed Patient Response: Cooperative  Modified Rankin (Stroke Patients Only)       Balance Overall balance assessment: Needs assistance Sitting-balance support: Bilateral upper extremity supported, Feet  supported Sitting balance-Leahy Scale: Good Sitting balance - Comments: good sitting balance on the edge of the bed   Standing balance support: Bilateral upper extremity supported, Reliant on assistive device for balance, During functional activity Standing balance-Leahy Scale: Fair Standing balance comment: fair to good balance with RW                             Pertinent Vitals/Pain Pain Assessment Pain Assessment: 0-10 Pain Score: 8  Pain Location: neck Pain Intervention(s):  Monitored during session    Home Living Family/patient expects to be discharged to:: Private residence Living Arrangements: Other relatives Available Help at Discharge: Family;Available PRN/intermittently Type of Home: House Home Access: Stairs to enter;Ramped entrance Entrance Stairs-Rails: Right Entrance Stairs-Number of Steps: 1   Home Layout: One level Home Equipment: Agricultural Consultant (2 wheels)      Prior Function Prior Level of Function : Independent/Modified Independent               ADLs Comments: 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     Extremity/Trunk Assessment   Upper Extremity Assessment Upper Extremity Assessment: Right hand dominant    Lower Extremity Assessment Lower Extremity Assessment: Overall WFL for tasks assessed    Cervical / Trunk Assessment Cervical / Trunk Assessment:  (guarding of the neck)  Communication   Communication Communication: No apparent difficulties    Cognition Arousal: Alert Behavior During Therapy: WFL for tasks assessed/performed   PT - Cognitive impairments: No apparent impairments                         Following commands: Intact       Cueing       General Comments      Exercises     Assessment/Plan    PT Assessment Patient needs continued PT services  PT Problem List Decreased range of motion;Decreased activity tolerance;Decreased balance;Decreased mobility;Pain       PT Treatment Interventions Gait training;DME instruction;Functional mobility training;Therapeutic activities;Therapeutic exercise;Balance training;Patient/family education;Modalities;Neuromuscular re-education    PT Goals (Current goals can be found in the Care Plan section)  Acute Rehab PT Goals Patient Stated Goal: return home PT Goal Formulation: With patient/family Time For Goal Achievement: 04/20/24 Potential to Achieve Goals: Good    Frequency Min 2X/week     Co-evaluation               AM-PAC  PT 6 Clicks Mobility  Outcome Measure Help needed turning from your back to your side while in a flat bed without using bedrails?: None Help needed moving from lying on your back to sitting on the side of a flat bed without using bedrails?: A Little Help needed moving to and from a bed to a chair (including a wheelchair)?: A Little Help needed standing up from a chair using your arms (e.g., wheelchair or bedside chair)?: A Little Help needed to walk in hospital room?: A Little Help needed climbing 3-5 steps with a railing? : A Little 6 Click Score: 19    End of Session   Activity Tolerance: Patient limited by pain Patient left: in bed;with call bell/phone within reach Nurse Communication: Mobility status PT Visit Diagnosis: Unsteadiness on feet (R26.81);Other abnormalities of gait and mobility (R26.89);Pain Pain - part of body:  (neck)    Time: 9077-9054 PT Time Calculation (min) (ACUTE ONLY): 23 min   Charges:   PT Evaluation $PT Eval  Low Complexity: 1 Low   PT General Charges $$ ACUTE PT VISIT: 1 Visit        10:38 AM, 04/06/24 Lizett Chowning Small Staci Dack MPT [facility] [place] [phone] Ph:[phone]

## 2024-04-06 NOTE — Plan of Care (Signed)
  Problem: Education: Goal: Knowledge of General Education information will improve Description: Including pain rating scale, medication(s)/side effects and non-pharmacologic comfort measures Outcome: Progressing   Problem: Activity: Goal: Risk for activity intolerance will decrease Outcome: Progressing   Problem: Pain Managment: Goal: General experience of comfort will improve and/or be controlled Outcome: Progressing

## 2024-04-06 NOTE — Progress Notes (Signed)
 Pharmacy Antibiotic Note  Connor [name] is a 78 y.o. male admitted on 04/05/2024 with bacteremia.  Pharmacy has been consulted for vancomycin dosing.  Plan: Patient given vancomycin 1000 mg IV x 1 yesterday, 10/31, at 2112 Give vancomycin 2000 mg IV x1 to load patient followed by 1500 mg IV Q24H. Goal AUC 400-550. Expected AUC: 514.7 Expected Css min: 12.6 SCr used: 1.22  Weight used: IBW, Vd used: 0.72 (BMI 27.95) Continue to monitor renal function and follow culture results   Height: 5' 10 (177.8 cm) Weight: 88.4 kg (194 lb 12.8 oz) IBW/kg (Calculated) : 73  Temp (24hrs), Avg:98.9 F (37.2 C), Min:98.2 F (36.8 C), Max:99.4 F (37.4 C)  Recent Labs  Lab 04/05/24 1058 04/06/24 0340  WBC 11.4* 9.6  CREATININE 1.39* 1.22    Estimated Creatinine Clearance: 54.1 mL/min (by C-G formula based on SCr of 1.22 mg/dL).    Allergies  Allergen Reactions   Demerol [Meperidine] Diarrhea and Nausea And Vomiting    Antimicrobials this admission: 11/1 Vanc >>  11/1 Ceftriaxone x 1  Microbiology results: 10/31 BCx: 1/4 bottles (anaerobic) GPC   Thank you for allowing pharmacy to be a part of this patient's care.  Lum VEAR Mania, PharmD, BCPS 04/06/2024 6:00 PM

## 2024-04-06 NOTE — Progress Notes (Signed)
 PROGRESS NOTE    Patient: Connor [name]                            PCP: Sheryle Carwin, MD                    DOB: 28-Jan-1946            DOA: 04/05/2024 FMW:[id number]             DOS: 04/06/2024, 7:07 AM   LOS: 0 days   Date of Service: The patient was seen and examined on 04/06/2024  Subjective:   The patient was seen and examined this morning. Hemodynamically stable. Still complaining of the neck pain, denies any numbness tingling or weakness in arms or legs  Brief Narrative:    Connor [name] is a 78 y.o. male with medical history significant of hypertension, hyperlipidemia, atrial fibrillation on Eliquis who presents to the emergency department for evaluation of 2-day onset of neck pain.  He complained of waking up on Wednesday morning with neck pain described as  a crick .  Neck pain has since worsened bilaterally, but worse on the right.  Pain was rated as 10/10 on pain scale with no position of comfort.  Pain is aggravated with turning of neck/changing position.  Patient called his niece and asked her to take him to the ED for evaluation.  He denies fever, any known injury, numbness or tingling of upper extremities bilaterally.    ED course; He was tachycardic, BP was 147/84, other vital signs were within normal range.  WBC of 11.4, normal BMP except for blood glucose of 162, Cr. 1.39 (this was 1.50 on 09/26/2023).  Troponin 42 > 35, urinalysis was normal.  Magnesium 1.9, influenza A, B, SARS coronavirus 2, RSV was negative. Blood culture was pending.  MRI cervical spine with and without contrast showed small amount of fluid  surrounding the C1-C2 articulation, likely degenerative/inflammatory.  Multilevel degenerative changes with severe left foraminal stenosis at C3-C4 and severe right foraminal stenosis at C5-C6. CT neck with contrast showed no acute findings or discrete mass in the neck CT cervical spine without contrast showed no acute cervical spine fracture, but showed multilevel  cervical degenerative changes, most pronounced at C3-4, C4-5, and C5-6. Patient was empirically treated with IV vancomycin and ceftriaxone for presumed osteomyelitis.  Neurosurgery Dr. Gillie was consulted and ruled out osteomyelitis, discitis, prevertebral swelling, any cord changes or any epidural processes and states that there are no neurological surgical lesions. Pain medication, Zofran  was given.   Assessment & Plan:   Principal Problem:   Neck pain   Assessment and Plan: Torticollis Still complain of severe neck pain unable to have any range of motion at this point Imaging were all reviewed in detail-multilevel cervical degenerative changes, 20 emancipation or fracture. Continue oxycodone  5 mg every 4 hours as needed for moderate/severe pain Will add Zanaflex 2 mg p.o. 3 times daily IV Solu-Medrol, with intention of switching to p.o. prednisone -Home compresses, PT ordered  Despite imaging-no signs of infection afebrile, no leukocytosis, Patient was empirically treated with IV vancomycin and ceftriaxone for presumed osteomyelitis.  Neurosurgery Dr. Gillie was consulted and ruled out osteomyelitis, discitis, prevertebral swelling, any cord changes or any epidural processes and states that there are no neurological surgical lesions.   Elevated troponin possibly secondary to type II demand ischemia Troponin 42 >> 35; troponin flattened.  Patient denies any chest pain  Prediabetic Hyperglycemia possibly reactive No history of diabetes mellitus, anticipating hyperglycemia with IV steroids Initiating SSI CBG 162; Patient without history of T2DM Hemoglobin A1c: 5.9   Elevated creatinine possibly due to AKI vs CKD Dehydration Creatinine 1.39 (this was 1.50 on 09/26/2023, more prior labs for comparison was 11 years ago). Continue gentle hydration Renally adjust medications, avoid nephrotoxic agents/dehydration/hypotension   Essential hypertension Continue lisinopril,  Lopressor    Mixed hyperlipidemia Continue Zocor    GERD Continue Protonix   Chronic atrial fibrillation with RVR Continue Lopressor , Eliquis   Urinary Retention Continue Flomax  Consider placement of Foley catheter if symptoms persist. Patient states that he has had Foley due to similar problem in the past  Debility Acute on chronic, unstable gait, using walker with ambulation Consulting PT for evaluation   ----------------------------------------------------------------------------------------------------------------------------------------------- Nutritional status:  The patient's BMI is: Body mass index is 27.95 kg/m. I agree with the assessment and plan as outlined   ------------------------------------------------------------------------------------------------------------------------------------------------  DVT prophylaxis:  SCDs Start: 04/06/24 0106 apixaban (ELIQUIS) tablet 5 mg   Code Status:   Code Status: Full Code  Family Communication: No family member present at bedside-  -Advance care planning has been discussed.   Admission status:   Status is: Observation The patient remains OBS appropriate and will d/c before 2 midnights.   Disposition: From  - home             Planning for discharge in 1-2 days   Procedures:   No admission procedures for hospital encounter.   Antimicrobials:  Anti-infectives (From admission, onward)    Start     Dose/Rate Route Frequency Ordered Stop   04/05/24 2100  vancomycin (VANCOCIN) IVPB 1000 mg/200 mL premix        1,000 mg 200 mL/hr over 60 Minutes Intravenous  Once 04/05/24 2051 04/05/24 2221   04/05/24 2100  cefTRIAXone (ROCEPHIN) 1 g in sodium chloride  0.9 % 100 mL IVPB        1 g 200 mL/hr over 30 Minutes Intravenous  Once 04/05/24 2051 04/05/24 2146        Medication:   apixaban  5 mg Oral BID   lisinopril  20 mg Oral Daily   metoprolol  tartrate  100 mg Oral Daily   pantoprazole  40 mg Oral Daily    simvastatin   40 mg Oral Daily   tamsulosin   0.4 mg Oral Daily    acetaminophen  **OR** acetaminophen , cyclobenzaprine, ondansetron  **OR** ondansetron  (ZOFRAN ) IV, oxyCODONE    Objective:   Vitals:   04/05/24 2307 04/05/24 2309 04/06/24 0106 04/06/24 0351  BP: (!) 117/54   (!) 138/116  Pulse: 94   (!) 101  Resp: 18   18  Temp: 98.2 F (36.8 C)   99.2 F (37.3 C)  TempSrc: Oral   Oral  SpO2: 98%  96% 96%  Weight:  88.4 kg    Height:  5' 10 (1.778 m)      Intake/Output Summary (Last 24 hours) at 04/06/2024 0707 Last data filed at 04/06/2024 0641 Gross per 24 hour  Intake 719.3 ml  Output --  Net 719.3 ml   Filed Weights   04/05/24 1030 04/05/24 2309  Weight: 91.4 kg 88.4 kg     Physical examination:   General:  AAO x 3,  cooperative, no distress;  Neck pain bilateral trapezius area, with limited range of motion, denies any numbness tingling or weakness in arms or legs  HEENT:  Normocephalic, PERRL, otherwise with in Normal limits   Neuro:  Speech intact cognition  intact, No focal neurological findings CNII-XII intact. , normal motor and sensation, reflexes intact   Lungs:   Clear to auscultation BL, Respirations unlabored,  No wheezes / crackles  Cardio:    S1/S2, RRR, No murmure, No Rubs or Gallops   Abdomen:  Soft, non-tender, bowel sounds active all four quadrants, no guarding or peritoneal signs.  Muscular  skeletal:  Limited exam -global generalized weaknesses - in bed, able to move all 4 extremities,   2+ pulses,  symmetric, No pitting edema  Skin:  Dry, warm to touch, negative for any Rashes,  Wounds: Please see nursing documentation       ------------------------------------------------------------------------------------------------------------------------------------------    LABs:     Latest Ref Rng & Units 04/06/2024    3:40 AM 04/05/2024   10:58 AM 01/30/2013    5:54 AM  CBC  WBC 4.0 - 10.5 K/uL 9.6  11.4  10.3   Hemoglobin 13.0 - 17.0 g/dL  85.5  83.3  84.2   Hematocrit 39.0 - 52.0 % 44.4  48.6  46.0   Platelets 150 - 400 K/uL 211  235  179       Latest Ref Rng & Units 04/06/2024    3:40 AM 04/05/2024   10:58 AM 09/26/2023   10:30 AM  CMP  Glucose 70 - 99 mg/dL 889  837    BUN 8 - 23 mg/dL 18  20    Creatinine 9.38 - 1.24 mg/dL 8.77  8.60  8.49   Sodium 135 - 145 mmol/L 138  139    Potassium 3.5 - 5.1 mmol/L 4.3  3.9    Chloride 98 - 111 mmol/L 103  99    CO2 22 - 32 mmol/L 25  24    Calcium 8.9 - 10.3 mg/dL 9.0  89.6    Total Protein 6.5 - 8.1 g/dL 6.6     Total Bilirubin 0.0 - 1.2 mg/dL 1.7     Alkaline Phos 38 - 126 U/L 48     AST 15 - 41 U/L 12     ALT 0 - 44 U/L 12          Micro Results Recent Results (from the past 240 hours)  Resp panel by RT-PCR (RSV, Flu A&B, Covid) Urine, Clean Catch     Status: None   Collection Time: 04/05/24  2:47 PM   Specimen: Urine, Clean Catch; Nasal Swab  Result Value Ref Range Status   SARS Coronavirus 2 by RT PCR NEGATIVE NEGATIVE Final    Comment: (NOTE) SARS-CoV-2 target nucleic acids are NOT DETECTED.  The SARS-CoV-2 RNA is generally detectable in upper respiratory specimens during the acute phase of infection. The lowest concentration of SARS-CoV-2 viral copies this assay can detect is 138 copies/mL. A negative result does not preclude SARS-Cov-2 infection and should not be used as the sole basis for treatment or other patient management decisions. A negative result may occur with  improper specimen collection/handling, submission of specimen other than nasopharyngeal swab, presence of viral mutation(s) within the areas targeted by this assay, and inadequate number of viral copies(<138 copies/mL). A negative result must be combined with clinical observations, patient history, and epidemiological information. The expected result is Negative.  Fact Sheet for Patients:  bloggercourse.com  Fact Sheet for Healthcare Providers:   seriousbroker.it  This test is no t yet approved or cleared by the [place]  FDA and  has been authorized for detection and/or diagnosis of SARS-CoV-2 by FDA under an Emergency Use Authorization (EUA).  This EUA will remain  in effect (meaning this test can be used) for the duration of the COVID-19 declaration under Section 564(b)(1) of the Act, 21 U.S.C.section 360bbb-3(b)(1), unless the authorization is terminated  or revoked sooner.       Influenza A by PCR NEGATIVE NEGATIVE Final   Influenza B by PCR NEGATIVE NEGATIVE Final    Comment: (NOTE) The Xpert Xpress SARS-CoV-2/FLU/RSV plus assay is intended as an aid in the diagnosis of influenza from Nasopharyngeal swab specimens and should not be used as a sole basis for treatment. Nasal washings and aspirates are unacceptable for Xpert Xpress SARS-CoV-2/FLU/RSV testing.  Fact Sheet for Patients: bloggercourse.com  Fact Sheet for Healthcare Providers: seriousbroker.it  This test is not yet approved or cleared by the [place]  FDA and has been authorized for detection and/or diagnosis of SARS-CoV-2 by FDA under an Emergency Use Authorization (EUA). This EUA will remain in effect (meaning this test can be used) for the duration of the COVID-19 declaration under Section 564(b)(1) of the Act, 21 U.S.C. section 360bbb-3(b)(1), unless the authorization is terminated or revoked.     Resp Syncytial Virus by PCR NEGATIVE NEGATIVE Final    Comment: (NOTE) Fact Sheet for Patients: bloggercourse.com  Fact Sheet for Healthcare Providers: seriousbroker.it  This test is not yet approved or cleared by the [place]  FDA and has been authorized for detection and/or diagnosis of SARS-CoV-2 by FDA under an Emergency Use Authorization (EUA). This EUA will remain in effect (meaning this test can be used) for  the duration of the COVID-19 declaration under Section 564(b)(1) of the Act, 21 U.S.C. section 360bbb-3(b)(1), unless the authorization is terminated or revoked.  Performed at [facility], [address], [place], [place] [address]   Culture, blood (routine x 2)     Status: None (Preliminary result)   Collection Time: 04/05/24  9:02 PM   Specimen: BLOOD LEFT FOREARM  Result Value Ref Range Status   Specimen Description BLOOD LEFT FOREARM  Final   Special Requests   Final    BOTTLES DRAWN AEROBIC AND ANAEROBIC Blood Culture adequate volume Performed at [facility], [address] [place], [place] [address]    Culture PENDING  Incomplete   Report Status PENDING  Incomplete  Culture, blood (routine x 2)     Status: None (Preliminary result)   Collection Time: 04/05/24  9:04 PM   Specimen: BLOOD RIGHT ARM  Result Value Ref Range Status   Specimen Description BLOOD RIGHT ARM  Final   Special Requests   Final    BOTTLES DRAWN AEROBIC AND ANAEROBIC Blood Culture adequate volume Performed at [facility], [address], [place], [place] [address]    Culture PENDING  Incomplete   Report Status PENDING  Incomplete    Radiology Reports MR Cervical Spine W and Wo Contrast Addendum Date: 04/05/2024 DDENDUM: There is edema within the C3-C4 disc space but no T1-weighted signal changes to indicate osteomyelitis. However, discitis is a possibility. No prevertebral fluid collection or other secondary finding of infection. ---------------------------------------------------- Electronically signed by: Franky Stanford MD 04/05/2024 06:58 PM EDT RP Workstation: [id number]   Result Date: 04/05/2024 EXAM: MRI CERVICAL SPINE WITHOUT AND WITH CONTRAST 04/05/2024 06:07:48 PM TECHNIQUE: Multiplanar multisequence MRI of the cervical spine was performed with and without intravenous contrast. COMPARISON: Cervical spine CT 04/05/2024. CLINICAL HISTORY: Neck pain, infection suspected, positive xray/CT. FINDINGS:  BONES AND ALIGNMENT: Normal alignment. Normal vertebral body heights. Bone marrow signal is unremarkable. There is a small amount of  fluid surrounding the C1-C2 articulation. SPINAL CORD: Normal spinal cord size. No abnormal spinal cord signal. SOFT TISSUES: No paraspinal mass. C2-C3: Mild left facet hypertrophy and small disc bulge with left uncovertebral hypertrophy. Mild left foraminal stenosis. C3-C4: Disc space narrowing with left greater than right uncovertebral hypertrophy. Severe left foraminal stenosis. C4-C5: Small disc bulge with bilateral uncovertebral hypertrophy and mild facet hypertrophy. No spinal canal stenosis. Moderate left foraminal stenosis. C5-C6: Small disc bulge with uncovertebral spurring. Mild spinal canal stenosis. Severe right and moderate left foraminal stenosis. C6-C7: Disc space narrowing. No central spinal canal or neural foraminal stenosis. C7-T1: Disc space narrowing with small left asymmetric disc bulge. No central spinal canal or neural foraminal stenosis. IMPRESSION: 1. Small amount of fluid surrounding the C1-C2 articulation, likely degenerative/inflammatory. 2. Multilevel degenerative changes with severe left foraminal stenosis at C3-C4 and severe right foraminal stenosis at C5-C6. Electronically signed by: Franky Stanford MD 04/05/2024 06:34 PM EDT RP Workstation: [id number]   CT Soft Tissue Neck W Contrast Result Date: 04/05/2024 EXAM: CT NECK WITH CONTRAST 04/05/2024 06:41:32 PM TECHNIQUE: CT of the neck was performed with the administration of 75 mL of iohexol  (OMNIPAQUE ) 300 MG/ML solution. Multiplanar reformatted images are provided for review. Automated exposure control, iterative reconstruction, and/or weight based adjustment of the mA/kV was utilized to reduce the radiation dose to as low as reasonably achievable. COMPARISON: Cervical spine MRI 04/05/2024. CLINICAL HISTORY: Soft tissue infection suspected, neck, xray done. FINDINGS: AERODIGESTIVE TRACT: No discrete  mass. No edema. SALIVARY GLANDS: The parotid and submandibular glands are unremarkable. THYROID: Unremarkable. LYMPH NODES: No suspicious cervical lymphadenopathy. SOFT TISSUES: No mass or fluid collection. BRAIN, ORBITS, SINUSES AND MASTOIDS: Loss of gray-white differentiation within the right frontal operculum consistent with age-indeterminate infarct. Mild intracranial volume loss. Ocular lens replacements. No acute abnormality. LUNGS AND MEDIASTINUM: No acute abnormality. BONES: No CT correlate to the C1-C2 interface fluid seen on the earlier MRI. No focal bone abnormality. IMPRESSION: 1. No acute findings or discrete mass in the neck. 2. No CT correlate to the C1C2 interface fluid seen on recent cervical spine MRI. 3. 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 Consider further characterization with head CT or brain MRI. Electronically signed by: Franky Stanford MD 04/05/2024 06:53 PM EDT RP Workstation: [id number]   CT Cervical Spine Wo Contrast Result Date: 04/05/2024 CLINICAL DATA:  Posterior neck pain since Tuesday EXAM: CT CERVICAL SPINE WITHOUT CONTRAST TECHNIQUE: Multidetector CT imaging of the cervical spine was performed without intravenous contrast. Multiplanar CT image reconstructions were also generated. RADIATION DOSE REDUCTION: This exam was performed according to the departmental dose-optimization program which includes automated exposure control, adjustment of the mA and/or kV according to patient size and/or use of iterative reconstruction technique. COMPARISON:  None Available. FINDINGS: Alignment: Alignment is grossly anatomic. Skull base and vertebrae: No acute fracture. No primary bone lesion or focal pathologic process. Soft tissues and spinal canal: No prevertebral fluid or swelling. No visible canal hematoma. Disc levels:  Findings at individual levels are as follows: C2-3: Left predominant facet hypertrophy without compressive sequela. C3-4: Partial bony fusion across the disc  space and bilateral facet joints. Left foraminal disc osteophyte complex and left predominant facet hypertrophy Peri results in mild left neural foraminal encroachment. C4-5: Mild circumferential disc osteophyte complex and bilateral facet hypertrophy results in left greater than right neural foraminal encroachment. C5-6: Moderate circumferential disc osteophyte complex and mild bilateral facet hypertrophy results in right greater than left neural foraminal encroachment. C6-7: Mild circumferential disc osteophyte complex without significant  compressive sequela. C7-T1: Mild circumferential disc osteophyte complex without significant compressive sequela. Upper chest: Airway is patent. Visualized portions of the lung apices are clear. Other: Reconstructed images demonstrate no additional findings. IMPRESSION: 1. No acute cervical spine fracture. 2. Multilevel cervical degenerative changes, most pronounced at C3-4, C4-5, and C5-6. Electronically Signed   By: Ozell Daring M.D.   On: 04/05/2024 14:49   DG Chest Port 1 View Result Date: 04/05/2024 CLINICAL DATA:  sob EXAM: DG CHEST 1V PORT COMPARISON:  01/02/2006 FINDINGS: No focal airspace consolidation, pleural effusion, or pneumothorax. No cardiomegaly. Tortuous aorta with aortic atherosclerosis. No acute fracture or destructive lesions. Multilevel thoracic osteophytosis. IMPRESSION: No acute cardiopulmonary abnormality. Electronically Signed   By: Rogelia Myers M.D.   On: 04/05/2024 11:55    SIGNED: Adriana DELENA Grams, MD, FHM. FAAFP. Jolynn Pack - [facility] Time spent - 55 min.  In seeing, evaluating and examining the patient. Reviewing medical records, labs, drawn plan of care. [facility],  Pager (please use [email or web address] to page/ text) Please use Epic Secure Chat for non-urgent communication (7AM-7PM)  If 7PM-7AM, please contact night-coverage [email or web address], 04/06/2024, 7:07 AM

## 2024-04-06 NOTE — TOC Initial Note (Signed)
 Transition of Care [facility]) - Initial/Assessment Note    Patient Details  Name: Connor [name] MRN: [id number] Date of Birth: 04-01-1946  [facility]) CM/SW Contact:    Sharlyne Stabs, RN Phone Number: 04/06/2024, 3:25 PM  Clinical Narrative:      Patient admitted in OBS with neck pain. PT recommending Out patient PT. Patient states he lives with his sister, she gets PT and he watches and does the PT at home. Planning to discharge home tomorrow.              Expected Discharge Plan: Home/Self Care Barriers to Discharge: Barriers Resolved  Patient Goals and CMS Choice Patient states their goals for this hospitalization and ongoing recovery are:: return home CMS Medicare.gov Compare Post Acute Care list provided to:: Patient Choice offered to / list presented to : Patient [facility] [place] ownership interest in [facility].provided to:: Patient    Activities of Daily Living   ADL Screening (condition at time of admission) Independently performs ADLs?: Yes (appropriate for developmental age) Is the patient deaf or have difficulty hearing?: No Does the patient have difficulty seeing, even when wearing glasses/contacts?: No Does the patient have difficulty concentrating, remembering, or making decisions?: No  Permission Sought/Granted       Admission diagnosis:  Discitis of cervical region [M46.42] Neck pain [M54.2] Patient Active Problem List   Diagnosis Date Noted   Neck pain 04/05/2024   Urinary retention 04/06/2021   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   History of UTI 04/06/2021   History of colonic polyps    Hx of adenomatous colonic polyps 03/30/2015   Renal cyst, left 03/30/2015   PCP:  Sheryle Carwin, MD Pharmacy:   [facility] [place], [place] - [address] [place] [address] Phone: [phone] Fax: [phone]     Social Drivers of Health (SDOH) Social History: SDOH Screenings   Food  Insecurity: No Food Insecurity (04/05/2024)  Housing: Low Risk  (04/05/2024)  Transportation Needs: No Transportation Needs (04/05/2024)  Utilities: Not At Risk (04/05/2024)  Social Connections: Unknown (04/05/2024)  Tobacco Use: Medium Risk (04/05/2024)   SDOH Interventions:

## 2024-04-06 NOTE — Plan of Care (Signed)

## 2024-04-06 NOTE — Progress Notes (Signed)
 patient has been up to restroom several times in a short period, he is urinating.  I did a bladder scan on him and he has >541ml in his bladder.  He states that he has had to have a catheter on many occasions and also says he has seen a urologist and had his prostate trimmed.  Dr Manfred notified.

## 2024-04-07 DIAGNOSIS — M542 Cervicalgia: Secondary | ICD-10-CM | POA: Diagnosis not present

## 2024-04-07 LAB — BLOOD CULTURE ID PANEL (REFLEXED) - BCID2

## 2024-04-07 LAB — GLUCOSE, CAPILLARY
Glucose-Capillary: 97 mg/dL (ref 70–99)
Glucose-Capillary: 187 mg/dL — ABNORMAL HIGH (ref 70–99)
Glucose-Capillary: 213 mg/dL — ABNORMAL HIGH (ref 70–99)
Glucose-Capillary: 230 mg/dL — ABNORMAL HIGH (ref 70–99)

## 2024-04-07 LAB — CK: Total CK: 110 U/L (ref 49–397)

## 2024-04-07 MED ORDER — BIOTENE DRY MOUTH MT LIQD
15.0000 mL | Freq: Three times a day (TID) | OROMUCOSAL | Status: DC
  Administered 2024-04-07 – 2024-04-09 (×6): 15 mL via OROMUCOSAL

## 2024-04-07 MED ORDER — MUPIROCIN 2 % EX OINT
TOPICAL_OINTMENT | Freq: Two times a day (BID) | CUTANEOUS | Status: DC
  Administered 2024-04-07 (×2): 1 via NASAL
  Filled 2024-04-07 (×3): qty 22

## 2024-04-07 MED ORDER — PREDNISONE 10 MG PO TABS
50.0000 mg | ORAL_TABLET | Freq: Every day | ORAL | Status: DC
  Administered 2024-04-07 – 2024-04-09 (×3): 50 mg via ORAL
  Filled 2024-04-07 (×3): qty 1

## 2024-04-07 MED ORDER — VANCOMYCIN HCL 1500 MG/300ML IV SOLN
1500.0000 mg | Freq: Once | INTRAVENOUS | Status: AC
  Administered 2024-04-07: 1500 mg via INTRAVENOUS
  Filled 2024-04-07: qty 300

## 2024-04-07 NOTE — Plan of Care (Signed)

## 2024-04-07 NOTE — Progress Notes (Signed)
 PROGRESS NOTE    Patient: Connor [name]                            PCP: Sheryle Carwin, MD                    DOB: [birth year]            DOA: 04/05/2024 FMW:[id number]             DOS: 04/07/2024, 10:06 AM   LOS: 0 days   Date of Service: The patient was seen and examined on 04/07/2024  Subjective:   The patient was seen and examined this morning, stable no acute distress still complaining of neck pain, range of motion slightly improving Remain afebrile normotensive, pulmonary initial blood cultures grew Staph epidermidis  Brief Narrative:    Connor [name] is a 78 y.o. male with medical history significant of hypertension, hyperlipidemia, atrial fibrillation on Eliquis who presents to the emergency department for evaluation of 2-day onset of neck pain.  He complained of waking up on Wednesday morning with neck pain described as  a crick .  Neck pain has since worsened bilaterally, but worse on the right.  Pain was rated as 10/10 on pain scale with no position of comfort.  Pain is aggravated with turning of neck/changing position.  Patient called his niece and asked her to take him to the ED for evaluation.  He denies fever, any known injury, numbness or tingling of upper extremities bilaterally.    ED course; He was tachycardic, BP was 147/84, other vital signs were within normal range.  WBC of 11.4, normal BMP except for blood glucose of 162, Cr. 1.39 (this was 1.50 on 09/26/2023).  Troponin 42 > 35, urinalysis was normal.  Magnesium 1.9, influenza A, B, SARS coronavirus 2, RSV was negative. Blood culture was pending.  MRI cervical spine with and without contrast showed small amount of fluid  surrounding the C1-C2 articulation, likely degenerative/inflammatory.  Multilevel degenerative changes with severe left foraminal stenosis at C3-C4 and severe right foraminal stenosis at C5-C6. CT neck with contrast showed no acute findings or discrete mass in the neck CT cervical spine without contrast  showed no acute cervical spine fracture, but showed multilevel cervical degenerative changes, most pronounced at C3-4, C4-5, and C5-6. Patient was empirically treated with IV vancomycin and ceftriaxone for presumed osteomyelitis.  Neurosurgery Dr. Gillie was consulted and ruled out osteomyelitis, discitis, prevertebral swelling, any cord changes or any epidural processes and states that there are no neurological surgical lesions. Pain medication, Zofran  was given.   Assessment & Plan:   Principal Problem:   Neck pain   Assessment and Plan: Torticollis Still complaining of neck pain, stiffness, range of motion slightly improved  Continue soft collar, warm compress, PT    Imaging were all reviewed in detail-multilevel cervical degenerative changes, 20 emancipation or fracture. Continue oxycodone  5 mg every 4 hours as needed for moderate/severe pain Will continue Zanaflex 2 increased to 4 mg mg p.o. 3 times daily IV Solu-Medrol, witching to p.o. prednisone - Heat compresses, PT ordered   ?  Bacteremia Initial blood cultures growing Staph epidermidis -Repeating blood cultures today 04/07/2024 -Resuming empiric IV antibiotic vancomycin discontinued Rocephin  Rule out osteomyelitis Despite imaging-no signs of infection afebrile, no leukocytosis, Patient was empirically treated with IV vancomycin and ceftriaxone for presumed osteomyelitis.   Neurosurgery Dr. Gillie was consulted and ruled out osteomyelitis, discitis, prevertebral  swelling, any cord changes or any epidural processes and states that there are no neurological surgical lesions.   Elevated troponin possibly secondary to type II demand ischemia Troponin 42 >> 35; troponin flattened.  Patient denies any chest pain   Prediabetic Hyperglycemia possibly reactive and on steroids No history of diabetes mellitus, anticipating hyperglycemia with IV steroids Initiating SSI CBG 162; Patient without history of T2DM Hemoglobin  A1c: 5.9   Elevated creatinine possibly due to AKI vs CKD Dehydration Improved, DC IVF Creatinine 1.39 (this was 1.50 on 09/26/2023, more prior labs for comparison was 11 years ago). Renally adjust medications, avoid nephrotoxic agents/dehydration/hypotension Lab Results  Component Value Date   CREATININE 1.22 04/06/2024   CREATININE 1.39 (H) 04/05/2024   CREATININE 1.50 (H) 09/26/2023        Essential hypertension- Continue lisinopril, Lopressor    Mixed hyperlipidemia - Continue Zocor    GERD - Continue Protonix   Chronic atrial fibrillation with RVR- Continue Lopressor , Eliquis   Urinary Retention Continue Flomax  Consider placement of Foley catheter if symptoms persist. Patient states that he has had Foley due to similar problem in the past  Debility Acute on chronic, unstable gait, using walker with ambulation Consulting PT for evaluation   ----------------------------------------------------------------------------------------------------------------------------------------------- Nutritional status:  The patient's BMI is: Body mass index is 27.95 kg/m. I agree with the assessment and plan as outlined   ------------------------------------------------------------------------------------------------------------------------------------------------  DVT prophylaxis:  SCDs Start: 04/06/24 0106 apixaban (ELIQUIS) tablet 5 mg   Code Status:   Code Status: Full Code  Family Communication: No family member present at bedside-  -Advance care planning has been discussed.   Admission status:   Status is: Observation The patient remains OBS appropriate and will d/c before 2 midnights.   Disposition: From  - home             Planning for discharge in 1-2 days   Procedures:   No admission procedures for hospital encounter.   Antimicrobials:  Anti-infectives (From admission, onward)    Start     Dose/Rate Route Frequency Ordered Stop   04/07/24 1900  vancomycin  (VANCOREADY) IVPB 1500 mg/300 mL       Placed in Followed by Linked Group   1,500 mg 150 mL/hr over 120 Minutes Intravenous Every 24 hours 04/06/24 1804     04/06/24 1900  vancomycin (VANCOREADY) IVPB 2000 mg/400 mL       Placed in Followed by Linked Group   2,000 mg 200 mL/hr over 120 Minutes Intravenous  Once 04/06/24 1804 04/06/24 2150   04/06/24 1830  vancomycin (VANCOCIN) IVPB 1000 mg/200 mL premix  Status:  Discontinued        1,000 mg 200 mL/hr over 60 Minutes Intravenous Every 24 hours 04/06/24 1740 04/06/24 1804   04/05/24 2100  vancomycin (VANCOCIN) IVPB 1000 mg/200 mL premix        1,000 mg 200 mL/hr over 60 Minutes Intravenous  Once 04/05/24 2051 04/05/24 2221   04/05/24 2100  cefTRIAXone (ROCEPHIN) 1 g in sodium chloride  0.9 % 100 mL IVPB        1 g 200 mL/hr over 30 Minutes Intravenous  Once 04/05/24 2051 04/05/24 2146        Medication:   antiseptic oral rinse  15 mL Mouth Rinse TID   apixaban  5 mg Oral BID   insulin aspart  0-6 Units Subcutaneous TID WC   metoprolol  tartrate  12.5 mg Oral Daily   mupirocin ointment   Nasal BID   pantoprazole  40 mg Oral Daily   predniSONE  50 mg Oral Q breakfast   simvastatin   40 mg Oral Daily   tamsulosin   0.4 mg Oral Daily   tiZANidine  4 mg Oral TID    acetaminophen  **OR** acetaminophen , cyclobenzaprine, melatonin, ondansetron  **OR** ondansetron  (ZOFRAN ) IV, oxyCODONE    Objective:   Vitals:   04/06/24 1706 04/06/24 1923 04/07/24 0435 04/07/24 0820  BP: 93/65 121/67 130/77 131/83  Pulse: 62 88 78 86  Resp:  17 17   Temp:  97.7 F (36.5 C) 98 F (36.7 C) 97.9 F (36.6 C)  TempSrc:  Oral Oral Oral  SpO2:  98% 97% 100%  Weight:      Height:        Intake/Output Summary (Last 24 hours) at 04/07/2024 1006 Last data filed at 04/07/2024 0548 Gross per 24 hour  Intake 2312.8 ml  Output 1001 ml  Net 1311.8 ml   Filed Weights   04/05/24 1030 04/05/24 2309  Weight: 91.4 kg 88.4 kg     Physical examination:     General:  AAO x 3,  cooperative, no distress;   HEENT:  Still complaining of neck pain, neck muscle stiffness, some improvement Normocephalic, PERRL, otherwise with in Normal limits   Neuro:  CNII-XII intact. , normal motor and sensation, reflexes intact   Lungs:   Clear to auscultation BL, Respirations unlabored,  No wheezes / crackles  Cardio:    S1/S2, RRR, No murmure, No Rubs or Gallops   Abdomen:  Soft, non-tender, bowel sounds active all four quadrants, no guarding or peritoneal signs.  Muscular  skeletal:  Limited exam -global generalized weaknesses - in bed, able to move all 4 extremities,   2+ pulses,  symmetric, No pitting edema  Skin:  Dry, warm to touch, negative for any Rashes,  Wounds: Please see nursing documentation   ------------------------------------------------------------------------------------------------------------------------------------------    LABs:     Latest Ref Rng & Units 04/06/2024    3:40 AM 04/05/2024   10:58 AM 01/30/2013    5:54 AM  CBC  WBC 4.0 - 10.5 K/uL 9.6  11.4  10.3   Hemoglobin 13.0 - 17.0 g/dL 85.5  83.3  84.2   Hematocrit 39.0 - 52.0 % 44.4  48.6  46.0   Platelets 150 - 400 K/uL 211  235  179       Latest Ref Rng & Units 04/06/2024    3:40 AM 04/05/2024   10:58 AM 09/26/2023   10:30 AM  CMP  Glucose 70 - 99 mg/dL 889  837    BUN 8 - 23 mg/dL 18  20    Creatinine 9.38 - 1.24 mg/dL 8.77  8.60  8.49   Sodium 135 - 145 mmol/L 138  139    Potassium 3.5 - 5.1 mmol/L 4.3  3.9    Chloride 98 - 111 mmol/L 103  99    CO2 22 - 32 mmol/L 25  24    Calcium 8.9 - 10.3 mg/dL 9.0  89.6    Total Protein 6.5 - 8.1 g/dL 6.6     Total Bilirubin 0.0 - 1.2 mg/dL 1.7     Alkaline Phos 38 - 126 U/L 48     AST 15 - 41 U/L 12     ALT 0 - 44 U/L 12          Micro Results Recent Results (from the past 240 hours)  Resp panel by RT-PCR (RSV, Flu A&B, Covid) Urine, Clean Catch  Status: None   Collection Time: 04/05/24  2:47 PM    Specimen: Urine, Clean Catch; Nasal Swab  Result Value Ref Range Status   SARS Coronavirus 2 by RT PCR NEGATIVE NEGATIVE Final    Comment: (NOTE) SARS-CoV-2 target nucleic acids are NOT DETECTED.  The SARS-CoV-2 RNA is generally detectable in upper respiratory specimens during the acute phase of infection. The lowest concentration of SARS-CoV-2 viral copies this assay can detect is 138 copies/mL. A negative result does not preclude SARS-Cov-2 infection and should not be used as the sole basis for treatment or other patient management decisions. A negative result may occur with  improper specimen collection/handling, submission of specimen other than nasopharyngeal swab, presence of viral mutation(s) within the areas targeted by this assay, and inadequate number of viral copies(<138 copies/mL). A negative result must be combined with clinical observations, patient history, and epidemiological information. The expected result is Negative.  Fact Sheet for Patients:  bloggercourse.com  Fact Sheet for Healthcare Providers:  seriousbroker.it  This test is no t yet approved or cleared by the [place]  FDA and  has been authorized for detection and/or diagnosis of SARS-CoV-2 by FDA under an Emergency Use Authorization (EUA). This EUA will remain  in effect (meaning this test can be used) for the duration of the COVID-19 declaration under Section 564(b)(1) of the Act, 21 U.S.C.section 360bbb-3(b)(1), unless the authorization is terminated  or revoked sooner.       Influenza A by PCR NEGATIVE NEGATIVE Final   Influenza B by PCR NEGATIVE NEGATIVE Final    Comment: (NOTE) The Xpert Xpress SARS-CoV-2/FLU/RSV plus assay is intended as an aid in the diagnosis of influenza from Nasopharyngeal swab specimens and should not be used as a sole basis for treatment. Nasal washings and aspirates are unacceptable for Xpert Xpress  SARS-CoV-2/FLU/RSV testing.  Fact Sheet for Patients: bloggercourse.com  Fact Sheet for Healthcare Providers: seriousbroker.it  This test is not yet approved or cleared by the [place]  FDA and has been authorized for detection and/or diagnosis of SARS-CoV-2 by FDA under an Emergency Use Authorization (EUA). This EUA will remain in effect (meaning this test can be used) for the duration of the COVID-19 declaration under Section 564(b)(1) of the Act, 21 U.S.C. section 360bbb-3(b)(1), unless the authorization is terminated or revoked.     Resp Syncytial Virus by PCR NEGATIVE NEGATIVE Final    Comment: (NOTE) Fact Sheet for Patients: bloggercourse.com  Fact Sheet for Healthcare Providers: seriousbroker.it  This test is not yet approved or cleared by the [place]  FDA and has been authorized for detection and/or diagnosis of SARS-CoV-2 by FDA under an Emergency Use Authorization (EUA). This EUA will remain in effect (meaning this test can be used) for the duration of the COVID-19 declaration under Section 564(b)(1) of the Act, 21 U.S.C. section 360bbb-3(b)(1), unless the authorization is terminated or revoked.  Performed at [facility], [address], [place], [place] [address]   Culture, blood (routine x 2)     Status: None (Preliminary result)   Collection Time: 04/05/24  9:02 PM   Specimen: BLOOD LEFT FOREARM  Result Value Ref Range Status   Specimen Description BLOOD LEFT FOREARM  Final   Special Requests   Final    BOTTLES DRAWN AEROBIC AND ANAEROBIC Blood Culture adequate volume   Culture   Final    NO GROWTH 2 DAYS Performed at [facility], [address] [place], [place] [address]    Report Status PENDING  Incomplete  Culture, blood (routine x 2)     Status: None (Preliminary result)   Collection Time: 04/05/24  9:04 PM   Specimen: BLOOD RIGHT ARM  Result  Value Ref Range Status   Specimen Description   Final    BLOOD RIGHT ARM Performed at [facility], [address] [place], [place] [address]    Special Requests   Final    BOTTLES DRAWN AEROBIC AND ANAEROBIC Blood Culture adequate volume Performed at [facility], [address], [place], [place] [address]    Culture  Setup Time   Final    GRAM POSITIVE COCCI IN CLUSTERS ANAEROBIC BOTTLE ONLY Gram Stain Report Called to,Read Back By and Verified With: COLLINS @ 1737 ON 110125  BY HENDERSON L CRITICAL RESULT CALLED TO, READ BACK BY AND VERIFIED WITHBETHA GORMAN SPINDLE RN 04/07/2024 @ 0044 BY AB Performed at [facility], [address] [place], [place] [address]    Culture GRAM POSITIVE COCCI  Final   Report Status PENDING  Incomplete  Blood Culture ID Panel (Reflexed)     Status: Abnormal   Collection Time: 04/05/24  9:04 PM  Result Value Ref Range Status   Enterococcus faecalis NOT DETECTED NOT DETECTED Final   Enterococcus Faecium NOT DETECTED NOT DETECTED Final   Listeria monocytogenes NOT DETECTED NOT DETECTED Final   Staphylococcus species DETECTED (A) NOT DETECTED Final    Comment: CRITICAL RESULT CALLED TO, READ BACK BY AND VERIFIED WITH: S STRABER RN 04/07/2024 @ 0044 BY AB    Staphylococcus aureus (BCID) NOT DETECTED NOT DETECTED Final   Staphylococcus epidermidis DETECTED (A) NOT DETECTED Final    Comment: Methicillin (oxacillin) resistant coagulase negative staphylococcus. Possible blood culture contaminant (unless isolated from more than one blood culture draw or clinical case suggests pathogenicity). No antibiotic treatment is indicated for blood  culture contaminants. CRITICAL RESULT CALLED TO, READ BACK BY AND VERIFIED WITH: S STRABER RN 04/07/2024 @ 0044 BY AB    Staphylococcus lugdunensis NOT DETECTED NOT DETECTED Final   Streptococcus species NOT DETECTED NOT DETECTED Final   Streptococcus agalactiae NOT DETECTED NOT DETECTED Final   Streptococcus pneumoniae NOT  DETECTED NOT DETECTED Final   Streptococcus pyogenes NOT DETECTED NOT DETECTED Final   A.calcoaceticus-baumannii NOT DETECTED NOT DETECTED Final   Bacteroides fragilis NOT DETECTED NOT DETECTED Final   Enterobacterales NOT DETECTED NOT DETECTED Final   Enterobacter cloacae complex NOT DETECTED NOT DETECTED Final   Escherichia coli NOT DETECTED NOT DETECTED Final   Klebsiella aerogenes NOT DETECTED NOT DETECTED Final   Klebsiella oxytoca NOT DETECTED NOT DETECTED Final   Klebsiella pneumoniae NOT DETECTED NOT DETECTED Final   Proteus species NOT DETECTED NOT DETECTED Final   Salmonella species NOT DETECTED NOT DETECTED Final   Serratia marcescens NOT DETECTED NOT DETECTED Final   Haemophilus influenzae NOT DETECTED NOT DETECTED Final   Neisseria meningitidis NOT DETECTED NOT DETECTED Final   Pseudomonas aeruginosa NOT DETECTED NOT DETECTED Final   Stenotrophomonas maltophilia NOT DETECTED NOT DETECTED Final   Candida albicans NOT DETECTED NOT DETECTED Final   Candida auris NOT DETECTED NOT DETECTED Final   Candida glabrata NOT DETECTED NOT DETECTED Final   Candida krusei NOT DETECTED NOT DETECTED Final   Candida parapsilosis NOT DETECTED NOT DETECTED Final   Candida tropicalis NOT DETECTED NOT DETECTED Final   Cryptococcus neoformans/gattii NOT DETECTED NOT DETECTED Final   Methicillin resistance mecA/C DETECTED (A) NOT DETECTED Final    Comment: CRITICAL RESULT CALLED TO, READ BACK  BY AND VERIFIED WITH: GORMAN SPINDLE RN 04/07/2024 @ 0044 BY AB Performed at [facility], [address], [place], [place] [address]   Culture, blood (Routine X 2) w Reflex to ID Panel     Status: None (Preliminary result)   Collection Time: 04/07/24  8:07 AM   Specimen: BLOOD  Result Value Ref Range Status   Specimen Description BLOOD LEFT ANTECUBITAL  Final   Special Requests   Final    BOTTLES DRAWN AEROBIC AND ANAEROBIC Blood Culture adequate volume Performed at [facility], [address]  [place], [place] [address]    Culture PENDING  Incomplete   Report Status PENDING  Incomplete  Culture, blood (Routine X 2) w Reflex to ID Panel     Status: None (Preliminary result)   Collection Time: 04/07/24  8:10 AM   Specimen: BLOOD  Result Value Ref Range Status   Specimen Description BLOOD RIGHT ANTECUBITAL  Final   Special Requests   Final    BOTTLES DRAWN AEROBIC AND ANAEROBIC Blood Culture adequate volume Performed at [facility], [address], [place], [place] [address]    Culture PENDING  Incomplete   Report Status PENDING  Incomplete    Radiology Reports MR Cervical Spine W and Wo Contrast Addendum Date: 04/05/2024 DDENDUM: There is edema within the C3-C4 disc space but no T1-weighted signal changes to indicate osteomyelitis. However, discitis is a possibility. No prevertebral fluid collection or other secondary finding of infection. ---------------------------------------------------- Electronically signed by: Franky Stanford MD 04/05/2024 06:58 PM EDT RP Workstation: [id number]   Result Date: 04/05/2024 EXAM: MRI CERVICAL SPINE WITHOUT AND WITH CONTRAST 04/05/2024 06:07:48 PM TECHNIQUE: Multiplanar multisequence MRI of the cervical spine was performed with and without intravenous contrast. COMPARISON: Cervical spine CT 04/05/2024. CLINICAL HISTORY: Neck pain, infection suspected, positive xray/CT. FINDINGS: BONES AND ALIGNMENT: Normal alignment. Normal vertebral body heights. Bone marrow signal is unremarkable. There is a small amount of fluid surrounding the C1-C2 articulation. SPINAL CORD: Normal spinal cord size. No abnormal spinal cord signal. SOFT TISSUES: No paraspinal mass. C2-C3: Mild left facet hypertrophy and small disc bulge with left uncovertebral hypertrophy. Mild left foraminal stenosis. C3-C4: Disc space narrowing with left greater than right uncovertebral hypertrophy. Severe left foraminal stenosis. C4-C5: Small disc bulge with bilateral uncovertebral hypertrophy and  mild facet hypertrophy. No spinal canal stenosis. Moderate left foraminal stenosis. C5-C6: Small disc bulge with uncovertebral spurring. Mild spinal canal stenosis. Severe right and moderate left foraminal stenosis. C6-C7: Disc space narrowing. No central spinal canal or neural foraminal stenosis. C7-T1: Disc space narrowing with small left asymmetric disc bulge. No central spinal canal or neural foraminal stenosis. IMPRESSION: 1. Small amount of fluid surrounding the C1-C2 articulation, likely degenerative/inflammatory. 2. Multilevel degenerative changes with severe left foraminal stenosis at C3-C4 and severe right foraminal stenosis at C5-C6. Electronically signed by: Franky Stanford MD 04/05/2024 06:34 PM EDT RP Workstation: [id number]   CT Soft Tissue Neck W Contrast Result Date: 04/05/2024 EXAM: CT NECK WITH CONTRAST 04/05/2024 06:41:32 PM TECHNIQUE: CT of the neck was performed with the administration of 75 mL of iohexol  (OMNIPAQUE ) 300 MG/ML solution. Multiplanar reformatted images are provided for review. Automated exposure control, iterative reconstruction, and/or weight based adjustment of the mA/kV was utilized to reduce the radiation dose to as low as reasonably achievable. COMPARISON: Cervical spine MRI 04/05/2024. CLINICAL HISTORY: Soft tissue infection suspected, neck, xray done. FINDINGS: AERODIGESTIVE TRACT: No discrete mass. No edema. SALIVARY GLANDS: The parotid and submandibular glands are unremarkable. THYROID:  Unremarkable. LYMPH NODES: No suspicious cervical lymphadenopathy. SOFT TISSUES: No mass or fluid collection. BRAIN, ORBITS, SINUSES AND MASTOIDS: Loss of gray-white differentiation within the right frontal operculum consistent with age-indeterminate infarct. Mild intracranial volume loss. Ocular lens replacements. No acute abnormality. LUNGS AND MEDIASTINUM: No acute abnormality. BONES: No CT correlate to the C1-C2 interface fluid seen on the earlier MRI. No focal bone abnormality.  IMPRESSION: 1. No acute findings or discrete mass in the neck. 2. No CT correlate to the C1C2 interface fluid seen on recent cervical spine MRI. 3. 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 Consider further characterization with head CT or brain MRI. Electronically signed by: Franky Stanford MD 04/05/2024 06:53 PM EDT RP Workstation: [id number]   CT Cervical Spine Wo Contrast Result Date: 04/05/2024 CLINICAL DATA:  Posterior neck pain since Tuesday EXAM: CT CERVICAL SPINE WITHOUT CONTRAST TECHNIQUE: Multidetector CT imaging of the cervical spine was performed without intravenous contrast. Multiplanar CT image reconstructions were also generated. RADIATION DOSE REDUCTION: This exam was performed according to the departmental dose-optimization program which includes automated exposure control, adjustment of the mA and/or kV according to patient size and/or use of iterative reconstruction technique. COMPARISON:  None Available. FINDINGS: Alignment: Alignment is grossly anatomic. Skull base and vertebrae: No acute fracture. No primary bone lesion or focal pathologic process. Soft tissues and spinal canal: No prevertebral fluid or swelling. No visible canal hematoma. Disc levels:  Findings at individual levels are as follows: C2-3: Left predominant facet hypertrophy without compressive sequela. C3-4: Partial bony fusion across the disc space and bilateral facet joints. Left foraminal disc osteophyte complex and left predominant facet hypertrophy Peri results in mild left neural foraminal encroachment. C4-5: Mild circumferential disc osteophyte complex and bilateral facet hypertrophy results in left greater than right neural foraminal encroachment. C5-6: Moderate circumferential disc osteophyte complex and mild bilateral facet hypertrophy results in right greater than left neural foraminal encroachment. C6-7: Mild circumferential disc osteophyte complex without significant compressive sequela. C7-T1: Mild  circumferential disc osteophyte complex without significant compressive sequela. Upper chest: Airway is patent. Visualized portions of the lung apices are clear. Other: Reconstructed images demonstrate no additional findings. IMPRESSION: 1. No acute cervical spine fracture. 2. Multilevel cervical degenerative changes, most pronounced at C3-4, C4-5, and C5-6. Electronically Signed   By: Ozell Daring M.D.   On: 04/05/2024 14:49   DG Chest Port 1 View Result Date: 04/05/2024 CLINICAL DATA:  sob EXAM: DG CHEST 1V PORT COMPARISON:  01/02/2006 FINDINGS: No focal airspace consolidation, pleural effusion, or pneumothorax. No cardiomegaly. Tortuous aorta with aortic atherosclerosis. No acute fracture or destructive lesions. Multilevel thoracic osteophytosis. IMPRESSION: No acute cardiopulmonary abnormality. Electronically Signed   By: Rogelia Myers M.D.   On: 04/05/2024 11:55    SIGNED: Adriana DELENA Grams, MD, FHM. FAAFP. Jolynn [facility] - [facility] hospitalist Time spent - 55 min.  In seeing, evaluating and examining the patient. Reviewing medical records, labs, drawn plan of care. [facility],  Pager (please use [email or web address] to page/ text) Please use Epic Secure Chat for non-urgent communication (7AM-7PM)  If 7PM-7AM, please contact night-coverage [email or web address], 04/07/2024, 10:06 AM

## 2024-04-07 NOTE — Progress Notes (Signed)
   04/07/24 0050  Provider Notification  Provider Name/Title Erminio Cone  Date Provider Notified 04/07/24  Time Provider Notified 0050  Method of Notification  (secure chat)  Notification Reason Critical Result  Test performed and critical result positive blood cultures  Date Critical Result Received 04/07/24  Time Critical Result Received 0050  Provider response No new orders  Date of Provider Response 04/07/24  Time of Provider Response [phone]

## 2024-04-08 DIAGNOSIS — M542 Cervicalgia: Secondary | ICD-10-CM | POA: Diagnosis not present

## 2024-04-08 LAB — COMPREHENSIVE METABOLIC PANEL WITH GFR
ALT: 44 U/L (ref 0–44)
AST: 41 U/L (ref 15–41)
Albumin: 3.8 g/dL (ref 3.5–5.0)
Alkaline Phosphatase: 61 U/L (ref 38–126)
Anion gap: 10 (ref 5–15)
BUN: 27 mg/dL — ABNORMAL HIGH (ref 8–23)
CO2: 21 mmol/L — ABNORMAL LOW (ref 22–32)
Calcium: 9.3 mg/dL (ref 8.9–10.3)
Chloride: 103 mmol/L (ref 98–111)
Creatinine, Ser: 1.31 mg/dL — ABNORMAL HIGH (ref 0.61–1.24)
GFR, Estimated: 55 mL/min — ABNORMAL LOW (ref >60)
Glucose, Bld: 130 mg/dL — ABNORMAL HIGH (ref 70–99)
Potassium: 3.8 mmol/L (ref 3.5–5.1)
Sodium: 135 mmol/L (ref 135–145)
Total Bilirubin: 0.6 mg/dL (ref 0.0–1.2)
Total Protein: 6.9 g/dL (ref 6.5–8.1)

## 2024-04-08 LAB — CBC WITH DIFFERENTIAL/PLATELET
Abs Immature Granulocytes: 0.06 K/uL (ref 0.00–0.07)
Basophils Absolute: 0 K/uL (ref 0.0–0.1)
Basophils Relative: 0 %
Eosinophils Absolute: 0 K/uL (ref 0.0–0.5)
Eosinophils Relative: 0 %
HCT: 44.8 % (ref 39.0–52.0)
Hemoglobin: 15.1 g/dL (ref 13.0–17.0)
Immature Granulocytes: 1 %
Lymphocytes Relative: 13 %
Lymphs Abs: 1.2 K/uL (ref 0.7–4.0)
MCH: 29.5 pg (ref 26.0–34.0)
MCHC: 33.7 g/dL (ref 30.0–36.0)
MCV: 87.5 fL (ref 80.0–100.0)
Monocytes Absolute: 0.7 K/uL (ref 0.1–1.0)
Monocytes Relative: 8 %
Neutro Abs: 7.2 K/uL (ref 1.7–7.7)
Neutrophils Relative %: 78 %
Platelets: 275 K/uL (ref 150–400)
RBC: 5.12 MIL/uL (ref 4.22–5.81)
RDW: 13.7 % (ref 11.5–15.5)
WBC: 9.2 K/uL (ref 4.0–10.5)
nRBC: 0 % (ref 0.0–0.2)

## 2024-04-08 LAB — GLUCOSE, CAPILLARY
Glucose-Capillary: 111 mg/dL — ABNORMAL HIGH (ref 70–99)
Glucose-Capillary: 155 mg/dL — ABNORMAL HIGH (ref 70–99)
Glucose-Capillary: 156 mg/dL — ABNORMAL HIGH (ref 70–99)
Glucose-Capillary: 215 mg/dL — ABNORMAL HIGH (ref 70–99)

## 2024-04-08 NOTE — Plan of Care (Signed)

## 2024-04-08 NOTE — Progress Notes (Signed)
 PROGRESS NOTE    Patient: Connor [name]                            PCP: Sheryle Carwin, MD                    DOB: 01-Feb-1946            DOA: 04/05/2024 FMW:[id number]             DOS: 04/08/2024, 9:35 AM   LOS: 0 days   Date of Service: The patient was seen and examined on 04/08/2024  Subjective:   The patient was seen and examined this morning, reporting some mild improvement neck pain and stiffness still limited range of motion Hemodynamically stable  One of blood cultures grew staph -repeat cultures pending still on IV antibiotics  Brief Narrative:    Connor [name] is a 78 y.o. male with medical history significant of hypertension, hyperlipidemia, atrial fibrillation on Eliquis who presents to the emergency department for evaluation of 2-day onset of neck pain.  He complained of waking up on Wednesday morning with neck pain described as  a crick .  Neck pain has since worsened bilaterally, but worse on the right.  Pain was rated as 10/10 on pain scale with no position of comfort.  Pain is aggravated with turning of neck/changing position.  Patient called his niece and asked her to take him to the ED for evaluation.  He denies fever, any known injury, numbness or tingling of upper extremities bilaterally.    ED course; He was tachycardic, BP was 147/84, other vital signs were within normal range.  WBC of 11.4, normal BMP except for blood glucose of 162, Cr. 1.39 (this was 1.50 on 09/26/2023).  Troponin 42 > 35, urinalysis was normal.  Magnesium 1.9, influenza A, B, SARS coronavirus 2, RSV was negative. Blood culture was pending.  MRI cervical spine with and without contrast showed small amount of fluid  surrounding the C1-C2 articulation, likely degenerative/inflammatory.  Multilevel degenerative changes with severe left foraminal stenosis at C3-C4 and severe right foraminal stenosis at C5-C6. CT neck with contrast showed no acute findings or discrete mass in the neck CT cervical spine  without contrast showed no acute cervical spine fracture, but showed multilevel cervical degenerative changes, most pronounced at C3-4, C4-5, and C5-6. Patient was empirically treated with IV vancomycin and ceftriaxone for presumed osteomyelitis.  Neurosurgery Dr. Gillie was consulted and ruled out osteomyelitis, discitis, prevertebral swelling, any cord changes or any epidural processes and states that there are no neurological surgical lesions. Pain medication, Zofran  was given.   Assessment & Plan:   Principal Problem:   Neck pain   Assessment and Plan: Torticollis Mild improvement, still complaining of pain and stiffness, limited range of motion Imaging were all reviewed in detail-multilevel cervical degenerative changes, 20 emancipation or fracture. Continue oxycodone  5 mg every 4 hours as needed for moderate/severe pain Will continue Zanaflex 2 increased to 4 mg mg p.o. 3 times daily IV Solu-Medrol, witching to p.o. prednisone - Heat compresses, PT ordered   ?  Bacteremia Initial blood cultures growing Staph epidermidis -Repeating blood cultures today 04/07/2024 -Resuming empiric IV antibiotic vancomycin + Rocephin - If cultures remain negative, will de-escalate possible discontinue antibiotics  Rule out osteomyelitis Despite imaging-no signs of infection afebrile, no leukocytosis, Patient was empirically treated with IV vancomycin and ceftriaxone for presumed osteomyelitis.   Neurosurgery Dr. Gillie was consulted  and ruled out osteomyelitis, discitis, prevertebral swelling, any cord changes or any epidural processes and states that there are no neurological surgical lesions.   Elevated troponin possibly secondary to type II demand ischemia Troponin 42 >> 35; troponin flattened.  Patient denies any chest pain   Prediabetic Hyperglycemia possibly reactive and on steroids No history of diabetes mellitus, anticipating hyperglycemia with IV steroids>>> tapering off switch to  p.o. prednisone Initiating SSI CBG 162; Patient without history of T2DM Hemoglobin A1c: 5.9   Elevated creatinine possibly due to AKI vs CKD Dehydration Improved, DC IVF Creatinine 1.39 (this was 1.50 on 09/26/2023, more prior labs for comparison was 11 years ago). Renally adjust medications, avoid nephrotoxic agents/dehydration/hypotension Lab Results  Component Value Date   CREATININE 1.22 04/06/2024   CREATININE 1.39 (H) 04/05/2024   CREATININE 1.50 (H) 09/26/2023        Essential hypertension- Continue lisinopril, Lopressor    Mixed hyperlipidemia - Continue Zocor    GERD - Continue Protonix   Chronic atrial fibrillation with RVR- Continue Lopressor , Eliquis   Urinary Retention Continue Flomax  Consider placement of Foley catheter if symptoms persist. Patient states that he has had Foley due to similar problem in the past  Debility Acute on chronic, unstable gait, using walker with ambulation Consulting PT for evaluation   ----------------------------------------------------------------------------------------------------------------------------------------------- Nutritional status:  The patient's BMI is: Body mass index is 27.95 kg/m. I agree with the assessment and plan as outlined   ------------------------------------------------------------------------------------------------------------------------------------------------  DVT prophylaxis:  SCDs Start: 04/06/24 0106 apixaban (ELIQUIS) tablet 5 mg   Code Status:   Code Status: Full Code  Family Communication: No family member present at bedside-  -Advance care planning has been discussed.   Admission status:   Status is: Observation The patient remains OBS appropriate and will d/c before 2 midnights.   Disposition: From  - home             Anticipating discharging home in next 24 hours if blood cultures remain negative  Procedures:   No admission procedures for hospital  encounter.   Antimicrobials:  Anti-infectives (From admission, onward)    Start     Dose/Rate Route Frequency Ordered Stop   04/07/24 1900  vancomycin (VANCOREADY) IVPB 1500 mg/300 mL  Status:  Discontinued       Placed in Followed by Linked Group   1,500 mg 150 mL/hr over 120 Minutes Intravenous Every 24 hours 04/06/24 1804 04/07/24 1045   04/07/24 1800  vancomycin (VANCOREADY) IVPB 1500 mg/300 mL       Placed in Followed by Linked Group   1,500 mg 150 mL/hr over 120 Minutes Intravenous  Once 04/07/24 1045 04/07/24 1919   04/06/24 1900  vancomycin (VANCOREADY) IVPB 2000 mg/400 mL       Placed in Followed by Linked Group   2,000 mg 200 mL/hr over 120 Minutes Intravenous  Once 04/06/24 1804 04/06/24 2150   04/06/24 1830  vancomycin (VANCOCIN) IVPB 1000 mg/200 mL premix  Status:  Discontinued        1,000 mg 200 mL/hr over 60 Minutes Intravenous Every 24 hours 04/06/24 1740 04/06/24 1804   04/05/24 2100  vancomycin (VANCOCIN) IVPB 1000 mg/200 mL premix        1,000 mg 200 mL/hr over 60 Minutes Intravenous  Once 04/05/24 2051 04/05/24 2221   04/05/24 2100  cefTRIAXone (ROCEPHIN) 1 g in sodium chloride  0.9 % 100 mL IVPB        1 g 200 mL/hr over 30 Minutes Intravenous  Once 04/05/24 2051  04/05/24 2146        Medication:   antiseptic oral rinse  15 mL Mouth Rinse TID   apixaban  5 mg Oral BID   insulin aspart  0-6 Units Subcutaneous TID WC   metoprolol  tartrate  12.5 mg Oral Daily   mupirocin ointment   Nasal BID   pantoprazole  40 mg Oral Daily   predniSONE  50 mg Oral Q breakfast   simvastatin   40 mg Oral Daily   tamsulosin   0.4 mg Oral Daily   tiZANidine  4 mg Oral TID    acetaminophen  **OR** acetaminophen , cyclobenzaprine, melatonin, ondansetron  **OR** ondansetron  (ZOFRAN ) IV, oxyCODONE    Objective:   Vitals:   04/07/24 1257 04/07/24 1446 04/07/24 1942 04/08/24 0556  BP: 99/73 130/85 122/78 (!) 151/87  Pulse: 80 94 92 88  Resp: 18  18 18   Temp:   (!) 97.5  F (36.4 C) 97.6 F (36.4 C)  TempSrc:   Oral Oral  SpO2: 95%  96% 97%  Weight:      Height:        Intake/Output Summary (Last 24 hours) at 04/08/2024 0935 Last data filed at 04/08/2024 0557 Gross per 24 hour  Intake 1080 ml  Output --  Net 1080 ml   Filed Weights   04/05/24 1030 04/05/24 2309  Weight: 91.4 kg 88.4 kg     Physical examination:        General:  AAO x 3,  cooperative, no distress;   HEENT:  Neck pain and stiffness Normocephalic, PERRL, otherwise with in Normal limits   Neuro:  CNII-XII intact. , normal motor and sensation, reflexes intact   Lungs:   Clear to auscultation BL, Respirations unlabored,  No wheezes / crackles  Cardio:    S1/S2, RRR, No murmure, No Rubs or Gallops   Abdomen:  Soft, non-tender, bowel sounds active all four quadrants, no guarding or peritoneal signs.  Muscular  skeletal:  Limited exam -global generalized weaknesses - in bed, able to move all 4 extremities,   2+ pulses,  symmetric, No pitting edema  Skin:  Dry, warm to touch, negative for any Rashes,  Wounds: Please see nursing documentation           ------------------------------------------------------------------------------------------------------------------------------------------    LABs:     Latest Ref Rng & Units 04/06/2024    3:40 AM 04/05/2024   10:58 AM 01/30/2013    5:54 AM  CBC  WBC 4.0 - 10.5 K/uL 9.6  11.4  10.3   Hemoglobin 13.0 - 17.0 g/dL 85.5  83.3  84.2   Hematocrit 39.0 - 52.0 % 44.4  48.6  46.0   Platelets 150 - 400 K/uL 211  235  179       Latest Ref Rng & Units 04/06/2024    3:40 AM 04/05/2024   10:58 AM 09/26/2023   10:30 AM  CMP  Glucose 70 - 99 mg/dL 889  837    BUN 8 - 23 mg/dL 18  20    Creatinine 9.38 - 1.24 mg/dL 8.77  8.60  8.49   Sodium 135 - 145 mmol/L 138  139    Potassium 3.5 - 5.1 mmol/L 4.3  3.9    Chloride 98 - 111 mmol/L 103  99    CO2 22 - 32 mmol/L 25  24    Calcium 8.9 - 10.3 mg/dL 9.0  89.6    Total Protein 6.5  - 8.1 g/dL 6.6     Total Bilirubin 0.0 - 1.2 mg/dL  1.7     Alkaline Phos 38 - 126 U/L 48     AST 15 - 41 U/L 12     ALT 0 - 44 U/L 12          Micro Results Recent Results (from the past 240 hours)  Resp panel by RT-PCR (RSV, Flu A&B, Covid) Urine, Clean Catch     Status: None   Collection Time: 04/05/24  2:47 PM   Specimen: Urine, Clean Catch; Nasal Swab  Result Value Ref Range Status   SARS Coronavirus 2 by RT PCR NEGATIVE NEGATIVE Final    Comment: (NOTE) SARS-CoV-2 target nucleic acids are NOT DETECTED.  The SARS-CoV-2 RNA is generally detectable in upper respiratory specimens during the acute phase of infection. The lowest concentration of SARS-CoV-2 viral copies this assay can detect is 138 copies/mL. A negative result does not preclude SARS-Cov-2 infection and should not be used as the sole basis for treatment or other patient management decisions. A negative result may occur with  improper specimen collection/handling, submission of specimen other than nasopharyngeal swab, presence of viral mutation(s) within the areas targeted by this assay, and inadequate number of viral copies(<138 copies/mL). A negative result must be combined with clinical observations, patient history, and epidemiological information. The expected result is Negative.  Fact Sheet for Patients:  bloggercourse.com  Fact Sheet for Healthcare Providers:  seriousbroker.it  This test is no t yet approved or cleared by the [place]  FDA and  has been authorized for detection and/or diagnosis of SARS-CoV-2 by FDA under an Emergency Use Authorization (EUA). This EUA will remain  in effect (meaning this test can be used) for the duration of the COVID-19 declaration under Section 564(b)(1) of the Act, 21 U.S.C.section 360bbb-3(b)(1), unless the authorization is terminated  or revoked sooner.       Influenza A by PCR NEGATIVE NEGATIVE Final    Influenza B by PCR NEGATIVE NEGATIVE Final    Comment: (NOTE) The Xpert Xpress SARS-CoV-2/FLU/RSV plus assay is intended as an aid in the diagnosis of influenza from Nasopharyngeal swab specimens and should not be used as a sole basis for treatment. Nasal washings and aspirates are unacceptable for Xpert Xpress SARS-CoV-2/FLU/RSV testing.  Fact Sheet for Patients: bloggercourse.com  Fact Sheet for Healthcare Providers: seriousbroker.it  This test is not yet approved or cleared by the [place]  FDA and has been authorized for detection and/or diagnosis of SARS-CoV-2 by FDA under an Emergency Use Authorization (EUA). This EUA will remain in effect (meaning this test can be used) for the duration of the COVID-19 declaration under Section 564(b)(1) of the Act, 21 U.S.C. section 360bbb-3(b)(1), unless the authorization is terminated or revoked.     Resp Syncytial Virus by PCR NEGATIVE NEGATIVE Final    Comment: (NOTE) Fact Sheet for Patients: bloggercourse.com  Fact Sheet for Healthcare Providers: seriousbroker.it  This test is not yet approved or cleared by the [place]  FDA and has been authorized for detection and/or diagnosis of SARS-CoV-2 by FDA under an Emergency Use Authorization (EUA). This EUA will remain in effect (meaning this test can be used) for the duration of the COVID-19 declaration under Section 564(b)(1) of the Act, 21 U.S.C. section 360bbb-3(b)(1), unless the authorization is terminated or revoked.  Performed at [facility], [address], [place], [place] [address]   Culture, blood (routine x 2)     Status: None (Preliminary result)   Collection Time: 04/05/24  9:02 PM   Specimen: BLOOD LEFT FOREARM  Result  Value Ref Range Status   Specimen Description BLOOD LEFT FOREARM  Final   Special Requests   Final    BOTTLES DRAWN AEROBIC AND ANAEROBIC Blood  Culture adequate volume   Culture   Final    NO GROWTH 3 DAYS Performed at [facility], [address], [place], [place] [address]    Report Status PENDING  Incomplete  Culture, blood (routine x 2)     Status: None (Preliminary result)   Collection Time: 04/05/24  9:04 PM   Specimen: BLOOD RIGHT ARM  Result Value Ref Range Status   Specimen Description   Final    BLOOD RIGHT ARM Performed at [facility], [address] [place], [place] [address]    Special Requests   Final    BOTTLES DRAWN AEROBIC AND ANAEROBIC Blood Culture adequate volume Performed at [facility], [address] [place], [place] [address]    Culture  Setup Time   Final    GRAM POSITIVE COCCI IN CLUSTERS ANAEROBIC BOTTLE ONLY Gram Stain Report Called to,Read Back By and Verified With: COLLINS @ 1737 ON 110125  BY HENDERSON L CRITICAL RESULT CALLED TO, READ BACK BY AND VERIFIED WITH: GORMAN SPINDLE RN 04/07/2024 @ 0044 BY AB    Culture   Final    GRAM POSITIVE COCCI CULTURE REINCUBATED FOR BETTER GROWTH Performed at [facility], [address] [place], [place] [address]    Report Status PENDING  Incomplete  Blood Culture ID Panel (Reflexed)     Status: Abnormal   Collection Time: 04/05/24  9:04 PM  Result Value Ref Range Status   Enterococcus faecalis NOT DETECTED NOT DETECTED Final   Enterococcus Faecium NOT DETECTED NOT DETECTED Final   Listeria monocytogenes NOT DETECTED NOT DETECTED Final   Staphylococcus species DETECTED (A) NOT DETECTED Final    Comment: CRITICAL RESULT CALLED TO, READ BACK BY AND VERIFIED WITH: S STRABER RN 04/07/2024 @ 0044 BY AB    Staphylococcus aureus (BCID) NOT DETECTED NOT DETECTED Final   Staphylococcus epidermidis DETECTED (A) NOT DETECTED Final    Comment: Methicillin (oxacillin) resistant coagulase negative staphylococcus. Possible blood culture contaminant (unless isolated from more than one blood culture draw or clinical case suggests pathogenicity). No antibiotic  treatment is indicated for blood  culture contaminants. CRITICAL RESULT CALLED TO, READ BACK BY AND VERIFIED WITH: S STRABER RN 04/07/2024 @ 0044 BY AB    Staphylococcus lugdunensis NOT DETECTED NOT DETECTED Final   Streptococcus species NOT DETECTED NOT DETECTED Final   Streptococcus agalactiae NOT DETECTED NOT DETECTED Final   Streptococcus pneumoniae NOT DETECTED NOT DETECTED Final   Streptococcus pyogenes NOT DETECTED NOT DETECTED Final   A.calcoaceticus-baumannii NOT DETECTED NOT DETECTED Final   Bacteroides fragilis NOT DETECTED NOT DETECTED Final   Enterobacterales NOT DETECTED NOT DETECTED Final   Enterobacter cloacae complex NOT DETECTED NOT DETECTED Final   Escherichia coli NOT DETECTED NOT DETECTED Final   Klebsiella aerogenes NOT DETECTED NOT DETECTED Final   Klebsiella oxytoca NOT DETECTED NOT DETECTED Final   Klebsiella pneumoniae NOT DETECTED NOT DETECTED Final   Proteus species NOT DETECTED NOT DETECTED Final   Salmonella species NOT DETECTED NOT DETECTED Final   Serratia marcescens NOT DETECTED NOT DETECTED Final   Haemophilus influenzae NOT DETECTED NOT DETECTED Final   Neisseria meningitidis NOT DETECTED NOT DETECTED Final   Pseudomonas aeruginosa NOT DETECTED NOT DETECTED Final   Stenotrophomonas maltophilia NOT DETECTED NOT DETECTED Final   Candida albicans NOT DETECTED NOT DETECTED  Final   Candida auris NOT DETECTED NOT DETECTED Final   Candida glabrata NOT DETECTED NOT DETECTED Final   Candida krusei NOT DETECTED NOT DETECTED Final   Candida parapsilosis NOT DETECTED NOT DETECTED Final   Candida tropicalis NOT DETECTED NOT DETECTED Final   Cryptococcus neoformans/gattii NOT DETECTED NOT DETECTED Final   Methicillin resistance mecA/C DETECTED (A) NOT DETECTED Final    Comment: CRITICAL RESULT CALLED TO, READ BACK BY AND VERIFIED WITHBETHA GORMAN SPINDLE RN 04/07/2024 @ 0044 BY AB Performed at [facility], [address], [place], [place] [address]   Culture,  blood (Routine X 2) w Reflex to ID Panel     Status: None (Preliminary result)   Collection Time: 04/07/24  8:07 AM   Specimen: BLOOD  Result Value Ref Range Status   Specimen Description BLOOD LEFT ANTECUBITAL  Final   Special Requests   Final    BOTTLES DRAWN AEROBIC AND ANAEROBIC Blood Culture adequate volume   Culture   Final    NO GROWTH < 24 HOURS Performed at [facility], [address], [place], [place] [address]    Report Status PENDING  Incomplete  Culture, blood (Routine X 2) w Reflex to ID Panel     Status: None (Preliminary result)   Collection Time: 04/07/24  8:10 AM   Specimen: BLOOD  Result Value Ref Range Status   Specimen Description BLOOD RIGHT ANTECUBITAL  Final   Special Requests   Final    BOTTLES DRAWN AEROBIC AND ANAEROBIC Blood Culture adequate volume   Culture   Final    NO GROWTH < 24 HOURS Performed at [facility], [address] [place], [place] [address]    Report Status PENDING  Incomplete    Radiology Reports MR Cervical Spine W and Wo Contrast Addendum Date: 04/05/2024 DDENDUM: There is edema within the C3-C4 disc space but no T1-weighted signal changes to indicate osteomyelitis. However, discitis is a possibility. No prevertebral fluid collection or other secondary finding of infection. ---------------------------------------------------- Electronically signed by: Franky Stanford MD 04/05/2024 06:58 PM EDT RP Workstation: [id number]   Result Date: 04/05/2024 EXAM: MRI CERVICAL SPINE WITHOUT AND WITH CONTRAST 04/05/2024 06:07:48 PM TECHNIQUE: Multiplanar multisequence MRI of the cervical spine was performed with and without intravenous contrast. COMPARISON: Cervical spine CT 04/05/2024. CLINICAL HISTORY: Neck pain, infection suspected, positive xray/CT. FINDINGS: BONES AND ALIGNMENT: Normal alignment. Normal vertebral body heights. Bone marrow signal is unremarkable. There is a small amount of fluid surrounding the C1-C2 articulation. SPINAL CORD: Normal  spinal cord size. No abnormal spinal cord signal. SOFT TISSUES: No paraspinal mass. C2-C3: Mild left facet hypertrophy and small disc bulge with left uncovertebral hypertrophy. Mild left foraminal stenosis. C3-C4: Disc space narrowing with left greater than right uncovertebral hypertrophy. Severe left foraminal stenosis. C4-C5: Small disc bulge with bilateral uncovertebral hypertrophy and mild facet hypertrophy. No spinal canal stenosis. Moderate left foraminal stenosis. C5-C6: Small disc bulge with uncovertebral spurring. Mild spinal canal stenosis. Severe right and moderate left foraminal stenosis. C6-C7: Disc space narrowing. No central spinal canal or neural foraminal stenosis. C7-T1: Disc space narrowing with small left asymmetric disc bulge. No central spinal canal or neural foraminal stenosis. IMPRESSION: 1. Small amount of fluid surrounding the C1-C2 articulation, likely degenerative/inflammatory. 2. Multilevel degenerative changes with severe left foraminal stenosis at C3-C4 and severe right foraminal stenosis at C5-C6. Electronically signed by: Franky Stanford MD 04/05/2024 06:34 PM EDT RP Workstation: [id number]   CT Soft Tissue Neck W Contrast Result Date: 04/05/2024  EXAM: CT NECK WITH CONTRAST 04/05/2024 06:41:32 PM TECHNIQUE: CT of the neck was performed with the administration of 75 mL of iohexol  (OMNIPAQUE ) 300 MG/ML solution. Multiplanar reformatted images are provided for review. Automated exposure control, iterative reconstruction, and/or weight based adjustment of the mA/kV was utilized to reduce the radiation dose to as low as reasonably achievable. COMPARISON: Cervical spine MRI 04/05/2024. CLINICAL HISTORY: Soft tissue infection suspected, neck, xray done. FINDINGS: AERODIGESTIVE TRACT: No discrete mass. No edema. SALIVARY GLANDS: The parotid and submandibular glands are unremarkable. THYROID: Unremarkable. LYMPH NODES: No suspicious cervical lymphadenopathy. SOFT TISSUES: No mass or fluid  collection. BRAIN, ORBITS, SINUSES AND MASTOIDS: Loss of gray-white differentiation within the right frontal operculum consistent with age-indeterminate infarct. Mild intracranial volume loss. Ocular lens replacements. No acute abnormality. LUNGS AND MEDIASTINUM: No acute abnormality. BONES: No CT correlate to the C1-C2 interface fluid seen on the earlier MRI. No focal bone abnormality. IMPRESSION: 1. No acute findings or discrete mass in the neck. 2. No CT correlate to the C1C2 interface fluid seen on recent cervical spine MRI. 3. 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 Consider further characterization with head CT or brain MRI. Electronically signed by: Franky Stanford MD 04/05/2024 06:53 PM EDT RP Workstation: [id number]   CT Cervical Spine Wo Contrast Result Date: 04/05/2024 CLINICAL DATA:  Posterior neck pain since Tuesday EXAM: CT CERVICAL SPINE WITHOUT CONTRAST TECHNIQUE: Multidetector CT imaging of the cervical spine was performed without intravenous contrast. Multiplanar CT image reconstructions were also generated. RADIATION DOSE REDUCTION: This exam was performed according to the departmental dose-optimization program which includes automated exposure control, adjustment of the mA and/or kV according to patient size and/or use of iterative reconstruction technique. COMPARISON:  None Available. FINDINGS: Alignment: Alignment is grossly anatomic. Skull base and vertebrae: No acute fracture. No primary bone lesion or focal pathologic process. Soft tissues and spinal canal: No prevertebral fluid or swelling. No visible canal hematoma. Disc levels:  Findings at individual levels are as follows: C2-3: Left predominant facet hypertrophy without compressive sequela. C3-4: Partial bony fusion across the disc space and bilateral facet joints. Left foraminal disc osteophyte complex and left predominant facet hypertrophy Peri results in mild left neural foraminal encroachment. C4-5: Mild circumferential  disc osteophyte complex and bilateral facet hypertrophy results in left greater than right neural foraminal encroachment. C5-6: Moderate circumferential disc osteophyte complex and mild bilateral facet hypertrophy results in right greater than left neural foraminal encroachment. C6-7: Mild circumferential disc osteophyte complex without significant compressive sequela. C7-T1: Mild circumferential disc osteophyte complex without significant compressive sequela. Upper chest: Airway is patent. Visualized portions of the lung apices are clear. Other: Reconstructed images demonstrate no additional findings. IMPRESSION: 1. No acute cervical spine fracture. 2. Multilevel cervical degenerative changes, most pronounced at C3-4, C4-5, and C5-6. Electronically Signed   By: Ozell Daring M.D.   On: 04/05/2024 14:49   DG Chest Port 1 View Result Date: 04/05/2024 CLINICAL DATA:  sob EXAM: DG CHEST 1V PORT COMPARISON:  01/02/2006 FINDINGS: No focal airspace consolidation, pleural effusion, or pneumothorax. No cardiomegaly. Tortuous aorta with aortic atherosclerosis. No acute fracture or destructive lesions. Multilevel thoracic osteophytosis. IMPRESSION: No acute cardiopulmonary abnormality. Electronically Signed   By: Rogelia Myers M.D.   On: 04/05/2024 11:55    SIGNED: Adriana DELENA Grams, MD, FHM. FAAFP. Jolynn Pack - [facility] Time spent - 55 min.  In seeing, evaluating and examining the patient. Reviewing medical records, labs, drawn plan of care. [facility],  Pager (please use [email or web address] to  page/ text) Please use Epic Secure Chat for non-urgent communication (7AM-7PM)  If 7PM-7AM, please contact night-coverage [email or web address], 04/08/2024, 9:35 AM

## 2024-04-08 NOTE — Progress Notes (Signed)
 Mobility Specialist Progress Note:    04/08/24 0900  Mobility  Activity Ambulated with assistance  Level of Assistance Standby assist, set-up cues, supervision of patient - no hands on  Assistive Device Front wheel walker  Distance Ambulated (ft) 15 ft  Range of Motion/Exercises Active;All extremities  Activity Response Tolerated well  Mobility Referral Yes  Mobility visit 1 Mobility  Mobility Specialist Start Time (ACUTE ONLY) 0900  Mobility Specialist Stop Time (ACUTE ONLY) 0920  Mobility Specialist Time Calculation (min) (ACUTE ONLY) 20 min   Pt received in bed, requesting assistance to bathroom. Required SBA to stand and ambulate with RW. Tolerated well, asx throughout. Left sitting EOB, RN in room. All needs met.  Ancel Easler Mobility Specialist Please contact via Special Educational Needs Teacher or  Rehab office at [phone]

## 2024-04-09 DIAGNOSIS — M542 Cervicalgia: Secondary | ICD-10-CM | POA: Diagnosis not present

## 2024-04-09 LAB — CULTURE, BLOOD (ROUTINE X 2): Special Requests: ADEQUATE

## 2024-04-09 LAB — GLUCOSE, CAPILLARY
Glucose-Capillary: 116 mg/dL — ABNORMAL HIGH (ref 70–99)
Glucose-Capillary: 156 mg/dL — ABNORMAL HIGH (ref 70–99)

## 2024-04-09 NOTE — Discharge Summary (Signed)
 Physician Discharge Summary   Patient: Connor [name] MRN: [id number] DOB: 02-Jun-1946  Admit date:     04/05/2024  Discharge date: 04/09/24  Discharge Physician: Adriana DELENA Grams   PCP: Sheryle Carwin, MD   Recommendations at discharge:   Follow-up with a primary care in 1-2 weeks Sinew current medications- Continue PT  Discharge Diagnoses: Principal Problem:   Neck pain  Resolved Problems:   * No resolved hospital problems. *  Hospital Course:  Connor [name] is a 78 y.o. male with medical history significant of hypertension, hyperlipidemia, atrial fibrillation on Eliquis who presents to the emergency department for evaluation of 2-day onset of neck pain.  He complained of waking up on Wednesday morning with neck pain described as  a crick .  Neck pain has since worsened bilaterally, but worse on the right.  Pain was rated as 10/10 on pain scale with no position of comfort.  Pain is aggravated with turning of neck/changing position.  Patient called his niece and asked her to take him to the ED for evaluation.  He denies fever, any known injury, numbness or tingling of upper extremities bilaterally.    ED course; He was tachycardic, BP was 147/84, other vital signs were within normal range.  WBC of 11.4, normal BMP except for blood glucose of 162, Cr. 1.39 (this was 1.50 on 09/26/2023).  Troponin 42 > 35, urinalysis was normal.  Magnesium 1.9, influenza A, B, SARS coronavirus 2, RSV was negative. Blood culture was pending.  MRI cervical spine with and without contrast showed small amount of fluid  surrounding the C1-C2 articulation, likely degenerative/inflammatory.  Multilevel degenerative changes with severe left foraminal stenosis at C3-C4 and severe right foraminal stenosis at C5-C6. CT neck with contrast showed no acute findings or discrete mass in the neck CT cervical spine without contrast showed no acute cervical spine fracture, but showed multilevel cervical degenerative changes,  most pronounced at C3-4, C4-5, and C5-6. Patient was empirically treated with IV vancomycin and ceftriaxone for presumed osteomyelitis.  Neurosurgery Dr. Gillie was consulted and ruled out osteomyelitis, discitis, prevertebral swelling, any cord changes or any epidural processes and states that there are no neurological surgical lesions. Pain medication, Zofran  was given.  Torticollis Mild improvement, still complaining of pain and stiffness, limited range of motion Imaging were all reviewed in detail-multilevel cervical degenerative changes, 20 emancipation or fracture. Continue oxycodone  5 mg every 4 hours as needed for moderate/severe pain Will continue Zanaflex 2 increased to 4 mg mg p.o. 3 times daily IV Solu-Medrol, witching to p.o. prednisone - Heat compresses, PT     Bacteremia Was ruled out, subsequent blood cultures -revealed no growth to date Initial blood cultures growing Staph epidermidis -Repeating blood cultures today 04/07/2024 -no growth to date -Resuming empiric IV antibiotic vancomycin + Rocephin >> discontinued 04/09/2024    Rule out osteomyelitis Despite imaging-no signs of infection afebrile, no leukocytosis, Patient was empirically treated with IV vancomycin and ceftriaxone for presumed osteomyelitis.   Neurosurgery Dr. Gillie was consulted and ruled out osteomyelitis, discitis, prevertebral swelling, any cord changes or any epidural processes and states that there are no neurological surgical lesions.     Elevated troponin possibly secondary to type II demand ischemia Troponin 42 >> 35; troponin flattened.  Patient denies any chest pain   Prediabetic Hyperglycemia possibly reactive and on steroids No history of diabetes mellitus, anticipating hyperglycemia with IV steroids>>> tapering off switch to p.o. prednisone Initiating SSI CBG 162; Patient without history of T2DM Hemoglobin  A1c: 5.9   Elevated creatinine possibly due to AKI vs  CKD Dehydration Improved, DC IVF Creatinine 1.39 (this was 1.50 on 09/26/2023, more prior labs for comparison was 11 years ago). Renally adjust medications, avoid nephrotoxic agents/dehydration/hypotension Lab Results  Component Value Date   CREATININE 1.31 (H) 04/08/2024   CREATININE 1.22 04/06/2024   CREATININE 1.39 (H) 04/05/2024          Essential hypertension- Continue lisinopril, Lopressor    Mixed hyperlipidemia - Continue Zocor    GERD - Continue Protonix   Chronic atrial fibrillation with RVR- Continue Lopressor , Eliquis   Urinary Retention Continue Flomax  Consider placement of Foley catheter if symptoms persist. Patient states that he has had Foley due to similar problem in the past   Debility Acute on chronic, unstable gait, using walker with ambulation Consulting PT for evaluation   Consultants: Surgery Procedures performed: Head imaging including MRI Disposition: Home Diet recommendation:  Discharge Diet Orders (From admission, onward)     Start     Ordered   04/08/24 0000  Diet - low sodium heart healthy        04/08/24 0814           Cardiac diet DISCHARGE MEDICATION: Allergies as of 04/09/2024       Reactions   Demerol [meperidine] Diarrhea, Nausea And Vomiting        Medication List     STOP taking these medications    cyclobenzaprine 10 MG tablet Commonly known as: FLEXERIL   OVER THE COUNTER MEDICATION   polyethylene glycol-electrolytes 420 g solution Commonly known as: TriLyte   sulfamethoxazole -trimethoprim  800-160 MG tablet Commonly known as: BACTRIM  DS       TAKE these medications    acetaminophen  650 MG CR tablet Commonly known as: TYLENOL  Take 650 mg by mouth every 8 (eight) hours as needed for pain.   Eliquis 5 MG Tabs tablet Generic drug: apixaban Take 5 mg by mouth 2 (two) times daily.   lisinopril 20 MG tablet Commonly known as: ZESTRIL Take 20 mg by mouth daily.   methylPREDNISolone 4 MG Tbpk  tablet Commonly known as: MEDROL DOSEPAK Medrol Dosepak take as instructed   metoprolol  tartrate 100 MG tablet Commonly known as: LOPRESSOR  Take 100 mg by mouth daily.   nitroGLYCERIN  0.4 MG SL tablet Commonly known as: NITROSTAT  Place 0.4 mg under the tongue every 5 (five) minutes as needed for chest pain.   oxyCODONE  5 MG immediate release tablet Commonly known as: Oxy IR/ROXICODONE  Take 1 tablet (5 mg total) by mouth every 4 (four) hours as needed for up to 3 days for moderate pain (pain score 4-6) or severe pain (pain score 7-10).   simvastatin  40 MG tablet Commonly known as: ZOCOR  Take 40 mg by mouth daily.   tamsulosin  0.4 MG Caps capsule Commonly known as: FLOMAX  Take 1 capsule (0.4 mg total) by mouth daily. What changed: when to take this   tiZANidine 2 MG tablet Commonly known as: ZANAFLEX Take 1 tablet (2 mg total) by mouth 3 (three) times daily.   trandolapril  2 MG tablet Commonly known as: MAVIK  Take 2 mg by mouth daily.        Discharge Exam: Filed Weights   04/05/24 1030 04/05/24 2309  Weight: 91.4 kg 88.4 kg        General:  AAO x 3,  cooperative, no distress;   HEENT:  Neck pain/stiffness, therefore limited range of motion neck for any spinal tenderness  normocephalic, PERRL, otherwise with in Normal limits  Neuro:  CNII-XII intact. , normal motor and sensation, reflexes intact   Lungs:   Clear to auscultation BL, Respirations unlabored,  No wheezes / crackles  Cardio:    S1/S2, RRR, No murmure, No Rubs or Gallops   Abdomen:  Soft, non-tender, bowel sounds active all four quadrants, no guarding or peritoneal signs.  Muscular  skeletal:  Limited exam -global generalized weaknesses - in bed, able to move all 4 extremities,   2+ pulses,  symmetric, No pitting edema  Skin:  Dry, warm to touch, negative for any Rashes,  Wounds: Please see nursing documentation          Condition at discharge: good  The results of significant diagnostics  from this hospitalization (including imaging, microbiology, ancillary and laboratory) are listed below for reference.   Imaging Studies: MR Cervical Spine W and Wo Contrast Addendum Date: 04/05/2024  ADDENDUM: There is edema within the C3-C4 disc space but no T1-weighted signal changes to indicate osteomyelitis. However, discitis is a possibility. No prevertebral fluid collection or other secondary finding of infection. ---------------------------------------------------- Electronically signed by: Franky Stanford MD 04/05/2024 06:58 PM EDT RP Workstation: [id number]   Result Date: 04/05/2024 XAM: MRI CERVICAL SPINE WITHOUT AND WITH CONTRAST 04/05/2024 06:07:48 PM TECHNIQUE: Multiplanar multisequence MRI of the cervical spine was performed with and without intravenous contrast. COMPARISON: Cervical spine CT 04/05/2024. CLINICAL HISTORY: Neck pain, infection suspected, positive xray/CT. FINDINGS: BONES AND ALIGNMENT: Normal alignment. Normal vertebral body heights. Bone marrow signal is unremarkable. There is a small amount of fluid surrounding the C1-C2 articulation. SPINAL CORD: Normal spinal cord size. No abnormal spinal cord signal. SOFT TISSUES: No paraspinal mass. C2-C3: Mild left facet hypertrophy and small disc bulge with left uncovertebral hypertrophy. Mild left foraminal stenosis. C3-C4: Disc space narrowing with left greater than right uncovertebral hypertrophy. Severe left foraminal stenosis. C4-C5: Small disc bulge with bilateral uncovertebral hypertrophy and mild facet hypertrophy. No spinal canal stenosis. Moderate left foraminal stenosis. C5-C6: Small disc bulge with uncovertebral spurring. Mild spinal canal stenosis. Severe right and moderate left foraminal stenosis. C6-C7: Disc space narrowing. No central spinal canal or neural foraminal stenosis. C7-T1: Disc space narrowing with small left asymmetric disc bulge. No central spinal canal or neural foraminal stenosis. IMPRESSION: 1. Small amount  of fluid surrounding the C1-C2 articulation, likely degenerative/inflammatory. 2. Multilevel degenerative changes with severe left foraminal stenosis at C3-C4 and severe right foraminal stenosis at C5-C6. Electronically signed by: Franky Stanford MD 04/05/2024 06:34 PM EDT RP Workstation: [id number]   CT Soft Tissue Neck W Contrast Result Date: 04/05/2024 EXAM: CT NECK WITH CONTRAST 04/05/2024 06:41:32 PM TECHNIQUE: CT of the neck was performed with the administration of 75 mL of iohexol  (OMNIPAQUE ) 300 MG/ML solution. Multiplanar reformatted images are provided for review. Automated exposure control, iterative reconstruction, and/or weight based adjustment of the mA/kV was utilized to reduce the radiation dose to as low as reasonably achievable. COMPARISON: Cervical spine MRI 04/05/2024. CLINICAL HISTORY: Soft tissue infection suspected, neck, xray done. FINDINGS: AERODIGESTIVE TRACT: No discrete mass. No edema. SALIVARY GLANDS: The parotid and submandibular glands are unremarkable. THYROID: Unremarkable. LYMPH NODES: No suspicious cervical lymphadenopathy. SOFT TISSUES: No mass or fluid collection. BRAIN, ORBITS, SINUSES AND MASTOIDS: Loss of gray-white differentiation within the right frontal operculum consistent with age-indeterminate infarct. Mild intracranial volume loss. Ocular lens replacements. No acute abnormality. LUNGS AND MEDIASTINUM: No acute abnormality. BONES: No CT correlate to the C1-C2 interface fluid seen on the earlier MRI. No focal bone abnormality. IMPRESSION: 1. No acute findings  or discrete mass in the neck. 2. No CT correlate to the C1C2 interface fluid seen on recent cervical spine MRI. 3. 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 Consider further characterization with head CT or brain MRI. Electronically signed by: Franky Stanford MD 04/05/2024 06:53 PM EDT RP Workstation: [id number]   CT Cervical Spine Wo Contrast Result Date: 04/05/2024 CLINICAL DATA:  Posterior neck pain  since Tuesday EXAM: CT CERVICAL SPINE WITHOUT CONTRAST TECHNIQUE: Multidetector CT imaging of the cervical spine was performed without intravenous contrast. Multiplanar CT image reconstructions were also generated. RADIATION DOSE REDUCTION: This exam was performed according to the departmental dose-optimization program which includes automated exposure control, adjustment of the mA and/or kV according to patient size and/or use of iterative reconstruction technique. COMPARISON:  None Available. FINDINGS: Alignment: Alignment is grossly anatomic. Skull base and vertebrae: No acute fracture. No primary bone lesion or focal pathologic process. Soft tissues and spinal canal: No prevertebral fluid or swelling. No visible canal hematoma. Disc levels:  Findings at individual levels are as follows: C2-3: Left predominant facet hypertrophy without compressive sequela. C3-4: Partial bony fusion across the disc space and bilateral facet joints. Left foraminal disc osteophyte complex and left predominant facet hypertrophy Peri results in mild left neural foraminal encroachment. C4-5: Mild circumferential disc osteophyte complex and bilateral facet hypertrophy results in left greater than right neural foraminal encroachment. C5-6: Moderate circumferential disc osteophyte complex and mild bilateral facet hypertrophy results in right greater than left neural foraminal encroachment. C6-7: Mild circumferential disc osteophyte complex without significant compressive sequela. C7-T1: Mild circumferential disc osteophyte complex without significant compressive sequela. Upper chest: Airway is patent. Visualized portions of the lung apices are clear. Other: Reconstructed images demonstrate no additional findings. IMPRESSION: 1. No acute cervical spine fracture. 2. Multilevel cervical degenerative changes, most pronounced at C3-4, C4-5, and C5-6. Electronically Signed   By: Ozell Daring M.D.   On: 04/05/2024 14:49   DG Chest Port 1  View Result Date: 04/05/2024 CLINICAL DATA:  sob EXAM: DG CHEST 1V PORT COMPARISON:  01/02/2006 FINDINGS: No focal airspace consolidation, pleural effusion, or pneumothorax. No cardiomegaly. Tortuous aorta with aortic atherosclerosis. No acute fracture or destructive lesions. Multilevel thoracic osteophytosis. IMPRESSION: No acute cardiopulmonary abnormality. Electronically Signed   By: Rogelia Myers M.D.   On: 04/05/2024 11:55    Microbiology: Results for orders placed or performed during the hospital encounter of 04/05/24  Resp panel by RT-PCR (RSV, Flu A&B, Covid) Urine, Clean Catch     Status: None   Collection Time: 04/05/24  2:47 PM   Specimen: Urine, Clean Catch; Nasal Swab  Result Value Ref Range Status   SARS Coronavirus 2 by RT PCR NEGATIVE NEGATIVE Final    Comment: (NOTE) SARS-CoV-2 target nucleic acids are NOT DETECTED.  The SARS-CoV-2 RNA is generally detectable in upper respiratory specimens during the acute phase of infection. The lowest concentration of SARS-CoV-2 viral copies this assay can detect is 138 copies/mL. A negative result does not preclude SARS-Cov-2 infection and should not be used as the sole basis for treatment or other patient management decisions. A negative result may occur with  improper specimen collection/handling, submission of specimen other than nasopharyngeal swab, presence of viral mutation(s) within the areas targeted by this assay, and inadequate number of viral copies(<138 copies/mL). A negative result must be combined with clinical observations, patient history, and epidemiological information. The expected result is Negative.  Fact Sheet for Patients:  bloggercourse.com  Fact Sheet for Healthcare Providers:  seriousbroker.it  This test  is no t yet approved or cleared by the [place]  FDA and  has been authorized for detection and/or diagnosis of SARS-CoV-2 by FDA under an  Emergency Use Authorization (EUA). This EUA will remain  in effect (meaning this test can be used) for the duration of the COVID-19 declaration under Section 564(b)(1) of the Act, 21 U.S.C.section 360bbb-3(b)(1), unless the authorization is terminated  or revoked sooner.       Influenza A by PCR NEGATIVE NEGATIVE Final   Influenza B by PCR NEGATIVE NEGATIVE Final    Comment: (NOTE) The Xpert Xpress SARS-CoV-2/FLU/RSV plus assay is intended as an aid in the diagnosis of influenza from Nasopharyngeal swab specimens and should not be used as a sole basis for treatment. Nasal washings and aspirates are unacceptable for Xpert Xpress SARS-CoV-2/FLU/RSV testing.  Fact Sheet for Patients: bloggercourse.com  Fact Sheet for Healthcare Providers: seriousbroker.it  This test is not yet approved or cleared by the [place]  FDA and has been authorized for detection and/or diagnosis of SARS-CoV-2 by FDA under an Emergency Use Authorization (EUA). This EUA will remain in effect (meaning this test can be used) for the duration of the COVID-19 declaration under Section 564(b)(1) of the Act, 21 U.S.C. section 360bbb-3(b)(1), unless the authorization is terminated or revoked.     Resp Syncytial Virus by PCR NEGATIVE NEGATIVE Final    Comment: (NOTE) Fact Sheet for Patients: bloggercourse.com  Fact Sheet for Healthcare Providers: seriousbroker.it  This test is not yet approved or cleared by the [place]  FDA and has been authorized for detection and/or diagnosis of SARS-CoV-2 by FDA under an Emergency Use Authorization (EUA). This EUA will remain in effect (meaning this test can be used) for the duration of the COVID-19 declaration under Section 564(b)(1) of the Act, 21 U.S.C. section 360bbb-3(b)(1), unless the authorization is terminated or revoked.  Performed at [facility],  [address], [place], [place] [address]   Culture, blood (routine x 2)     Status: None (Preliminary result)   Collection Time: 04/05/24  9:02 PM   Specimen: BLOOD LEFT FOREARM  Result Value Ref Range Status   Specimen Description BLOOD LEFT FOREARM  Final   Special Requests   Final    BOTTLES DRAWN AEROBIC AND ANAEROBIC Blood Culture adequate volume   Culture   Final    NO GROWTH 4 DAYS Performed at [facility], [address] [place], [place] [address]    Report Status PENDING  Incomplete  Culture, blood (routine x 2)     Status: Abnormal (Preliminary result)   Collection Time: 04/05/24  9:04 PM   Specimen: BLOOD RIGHT ARM  Result Value Ref Range Status   Specimen Description   Final    BLOOD RIGHT ARM Performed at [facility], [address] [place], [place] [address]    Special Requests   Final    BOTTLES DRAWN AEROBIC AND ANAEROBIC Blood Culture adequate volume Performed at [facility], [address], [place], [place] [address]    Culture  Setup Time   Final    GRAM POSITIVE COCCI IN CLUSTERS ANAEROBIC BOTTLE ONLY Gram Stain Report Called to,Read Back By and Verified With: COLLINS @ 1737 ON 110125  BY HENDERSON L CRITICAL RESULT CALLED TO, READ BACK BY AND VERIFIED WITH: S STRABER RN 04/07/2024 @ 0044 BY AB    Culture (A)  Final    STAPHYLOCOCCUS HOMINIS CULTURE REINCUBATED FOR BETTER GROWTH THE SIGNIFICANCE OF ISOLATING THIS ORGANISM FROM A SINGLE SET OF  BLOOD CULTURES WHEN MULTIPLE SETS ARE DRAWN IS UNCERTAIN. PLEASE NOTIFY THE MICROBIOLOGY DEPARTMENT WITHIN ONE WEEK IF SPECIATION AND SENSITIVITIES ARE REQUIRED. Performed at [facility], [address] [place], [place] [address]    Report Status PENDING  Incomplete  Blood Culture ID Panel (Reflexed)     Status: Abnormal   Collection Time: 04/05/24  9:04 PM  Result Value Ref Range Status   Enterococcus faecalis NOT DETECTED NOT DETECTED Final   Enterococcus Faecium NOT DETECTED NOT DETECTED Final   Listeria  monocytogenes NOT DETECTED NOT DETECTED Final   Staphylococcus species DETECTED (A) NOT DETECTED Final    Comment: CRITICAL RESULT CALLED TO, READ BACK BY AND VERIFIED WITH: S STRABER RN 04/07/2024 @ 0044 BY AB    Staphylococcus aureus (BCID) NOT DETECTED NOT DETECTED Final   Staphylococcus epidermidis DETECTED (A) NOT DETECTED Final    Comment: Methicillin (oxacillin) resistant coagulase negative staphylococcus. Possible blood culture contaminant (unless isolated from more than one blood culture draw or clinical case suggests pathogenicity). No antibiotic treatment is indicated for blood  culture contaminants. CRITICAL RESULT CALLED TO, READ BACK BY AND VERIFIED WITH: S STRABER RN 04/07/2024 @ 0044 BY AB    Staphylococcus lugdunensis NOT DETECTED NOT DETECTED Final   Streptococcus species NOT DETECTED NOT DETECTED Final   Streptococcus agalactiae NOT DETECTED NOT DETECTED Final   Streptococcus pneumoniae NOT DETECTED NOT DETECTED Final   Streptococcus pyogenes NOT DETECTED NOT DETECTED Final   A.calcoaceticus-baumannii NOT DETECTED NOT DETECTED Final   Bacteroides fragilis NOT DETECTED NOT DETECTED Final   Enterobacterales NOT DETECTED NOT DETECTED Final   Enterobacter cloacae complex NOT DETECTED NOT DETECTED Final   Escherichia coli NOT DETECTED NOT DETECTED Final   Klebsiella aerogenes NOT DETECTED NOT DETECTED Final   Klebsiella oxytoca NOT DETECTED NOT DETECTED Final   Klebsiella pneumoniae NOT DETECTED NOT DETECTED Final   Proteus species NOT DETECTED NOT DETECTED Final   Salmonella species NOT DETECTED NOT DETECTED Final   Serratia marcescens NOT DETECTED NOT DETECTED Final   Haemophilus influenzae NOT DETECTED NOT DETECTED Final   Neisseria meningitidis NOT DETECTED NOT DETECTED Final   Pseudomonas aeruginosa NOT DETECTED NOT DETECTED Final   Stenotrophomonas maltophilia NOT DETECTED NOT DETECTED Final   Candida albicans NOT DETECTED NOT DETECTED Final   Candida auris NOT  DETECTED NOT DETECTED Final   Candida glabrata NOT DETECTED NOT DETECTED Final   Candida krusei NOT DETECTED NOT DETECTED Final   Candida parapsilosis NOT DETECTED NOT DETECTED Final   Candida tropicalis NOT DETECTED NOT DETECTED Final   Cryptococcus neoformans/gattii NOT DETECTED NOT DETECTED Final   Methicillin resistance mecA/C DETECTED (A) NOT DETECTED Final    Comment: CRITICAL RESULT CALLED TO, READ BACK BY AND VERIFIED WITHBETHA GORMAN SPINDLE RN 04/07/2024 @ 0044 BY AB Performed at [facility], [address] [place], [place] [address]   Culture, blood (Routine X 2) w Reflex to ID Panel     Status: None (Preliminary result)   Collection Time: 04/07/24  8:07 AM   Specimen: BLOOD  Result Value Ref Range Status   Specimen Description BLOOD LEFT ANTECUBITAL  Final   Special Requests   Final    BOTTLES DRAWN AEROBIC AND ANAEROBIC Blood Culture adequate volume   Culture   Final    NO GROWTH 2 DAYS Performed at [facility], [address], [place], [place] [address]    Report Status PENDING  Incomplete  Culture, blood (Routine X 2) w Reflex  to ID Panel     Status: None (Preliminary result)   Collection Time: 04/07/24  8:10 AM   Specimen: BLOOD  Result Value Ref Range Status   Specimen Description BLOOD RIGHT ANTECUBITAL  Final   Special Requests   Final    BOTTLES DRAWN AEROBIC AND ANAEROBIC Blood Culture adequate volume   Culture   Final    NO GROWTH 2 DAYS Performed at [facility], [address], [place], [place] [address]    Report Status PENDING  Incomplete    Labs: CBC: Recent Labs  Lab 04/05/24 1058 04/06/24 0340 04/08/24 0905  WBC 11.4* 9.6 9.2  NEUTROABS  --   --  7.2  HGB 16.6 14.4 15.1  HCT 48.6 44.4 44.8  MCV 86.3 89.5 87.5  PLT 235 211 275   Basic Metabolic Panel: Recent Labs  Lab 04/05/24 1058 04/06/24 0340 04/08/24 0905  NA 139 138 135  K 3.9 4.3 3.8  CL 99 103 103  CO2 24 25 21*  GLUCOSE 162* 110* 130*  BUN 20 18 27*  CREATININE 1.39* 1.22  1.31*  CALCIUM 10.3 9.0 9.3  MG 1.9 1.9  --   PHOS  --  2.8  --    Liver Function Tests: Recent Labs  Lab 04/06/24 0340 04/08/24 0905  AST 12* 41  ALT 12 44  ALKPHOS 48 61  BILITOT 1.7* 0.6  PROT 6.6 6.9  ALBUMIN 3.7 3.8   CBG: Recent Labs  Lab 04/08/24 0740 04/08/24 1024 04/08/24 1618 04/08/24 2039 04/09/24 0730  GLUCAP 111* 155* 156* 215* 116*    Discharge time spent: greater than 40 minutes.  Signed: Adriana DELENA Grams, MD [facility] 04/09/2024

## 2024-04-09 NOTE — Plan of Care (Signed)

## 2024-04-10 LAB — CULTURE, BLOOD (ROUTINE X 2)
Culture: NO GROWTH
Special Requests: ADEQUATE

## 2024-04-12 LAB — CULTURE, BLOOD (ROUTINE X 2)
Culture: NO GROWTH
Culture: NO GROWTH
Special Requests: ADEQUATE
Special Requests: ADEQUATE

## 2024-04-16 DIAGNOSIS — M542 Cervicalgia: Secondary | ICD-10-CM | POA: Diagnosis not present

## 2024-04-16 DIAGNOSIS — Z23 Encounter for immunization: Secondary | ICD-10-CM | POA: Diagnosis not present

## 2024-04-16 DIAGNOSIS — N183 Chronic kidney disease, stage 3 unspecified: Secondary | ICD-10-CM | POA: Diagnosis not present

## 2024-05-23 ENCOUNTER — Other Ambulatory Visit (HOSPITAL_COMMUNITY): Payer: Self-pay
